# Patient Record
Sex: Female | Born: 1965 | Race: White | Hispanic: No | Marital: Married | Smoking: Former smoker
Health system: Southern US, Community
[De-identification: ages and names within clinical notes are randomized; demographics above are authoritative.]

## PROBLEM LIST (undated history)

## (undated) DIAGNOSIS — D225 Melanocytic nevi of trunk: Secondary | ICD-10-CM

## (undated) DIAGNOSIS — Z789 Other specified health status: Secondary | ICD-10-CM

## (undated) DIAGNOSIS — K602 Anal fissure, unspecified: Secondary | ICD-10-CM

## (undated) DIAGNOSIS — D229 Melanocytic nevi, unspecified: Secondary | ICD-10-CM

## (undated) DIAGNOSIS — K635 Polyp of colon: Secondary | ICD-10-CM

## (undated) HISTORY — PX: BREAST BIOPSY: SHX20

## (undated) HISTORY — DX: Polyp of colon: K63.5

## (undated) HISTORY — PX: ENDOMETRIAL ABLATION: SHX621

## (undated) HISTORY — PX: TUBAL LIGATION: SHX77

## (undated) HISTORY — DX: Anal fissure, unspecified: K60.2

---
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
## 2000-10-01 ENCOUNTER — Emergency Department (HOSPITAL_COMMUNITY): Admission: EM | Admit: 2000-10-01 | Discharge: 2000-10-01 | Payer: Self-pay | Admitting: *Deleted

## 2000-10-01 ENCOUNTER — Encounter: Payer: Self-pay | Admitting: *Deleted

## 2003-02-19 ENCOUNTER — Other Ambulatory Visit: Admission: RE | Admit: 2003-02-19 | Discharge: 2003-02-19 | Payer: Self-pay | Admitting: Obstetrics and Gynecology

## 2004-03-27 ENCOUNTER — Other Ambulatory Visit: Admission: RE | Admit: 2004-03-27 | Discharge: 2004-03-27 | Payer: Self-pay | Admitting: Obstetrics and Gynecology

## 2005-05-21 ENCOUNTER — Other Ambulatory Visit: Admission: RE | Admit: 2005-05-21 | Discharge: 2005-05-21 | Payer: Self-pay | Admitting: Obstetrics and Gynecology

## 2007-08-18 ENCOUNTER — Encounter: Admission: RE | Admit: 2007-08-18 | Discharge: 2007-08-18 | Payer: Self-pay | Admitting: Obstetrics and Gynecology

## 2008-08-03 ENCOUNTER — Encounter (INDEPENDENT_AMBULATORY_CARE_PROVIDER_SITE_OTHER): Payer: Self-pay | Admitting: Obstetrics and Gynecology

## 2008-08-03 ENCOUNTER — Ambulatory Visit (HOSPITAL_COMMUNITY): Admission: RE | Admit: 2008-08-03 | Discharge: 2008-08-03 | Payer: Self-pay | Admitting: Obstetrics and Gynecology

## 2010-02-19 ENCOUNTER — Encounter
Admission: RE | Admit: 2010-02-19 | Discharge: 2010-02-19 | Payer: Self-pay | Source: Home / Self Care | Attending: Obstetrics and Gynecology | Admitting: Obstetrics and Gynecology

## 2010-05-11 LAB — CBC
HCT: 33.8 % — ABNORMAL LOW (ref 36.0–46.0)
Hemoglobin: 11.2 g/dL — ABNORMAL LOW (ref 12.0–15.0)
MCHC: 33.3 g/dL (ref 30.0–36.0)
MCV: 78.7 fL (ref 78.0–100.0)
Platelets: 217 10*3/uL (ref 150–400)
RBC: 4.29 MIL/uL (ref 3.87–5.11)
RDW: 14.7 % (ref 11.5–15.5)
WBC: 5.3 10*3/uL (ref 4.0–10.5)

## 2010-05-11 LAB — HCG, SERUM, QUALITATIVE: Preg, Serum: NEGATIVE

## 2010-06-17 NOTE — Op Note (Signed)
Tonya Torres, GUNKEL               ACCOUNT NO.:  [id number]   MEDICAL RECORD NO.:  [id number]          PATIENT TYPE:  AMB   LOCATION:  SDC                           FACILITY:  [facility]   PHYSICIAN:  Juluis Mire, M.D.   DATE OF BIRTH:  02/02/1966   DATE OF PROCEDURE:  08/03/2008  DATE OF DISCHARGE:                               OPERATIVE REPORT   PREOPERATIVE DIAGNOSES:  Uterine fibroids with associated menorrhagia.  Desires sterility.   POSTOPERATIVE DIAGNOSES:  Uterine fibroids with associated menorrhagia.  Desires sterility.   PROCEDURES:  Attempted hydrothermal ablation.  Followed by hysteroscopy  with ThermaChoice ablation.  Laparoscopic bilateral tubal ligation.   SURGEON:  Juluis Mire, MD   ANESTHESIA:  General endotracheal.   ESTIMATED BLOOD LOSS:  Minimal.   PACKS AND DRAINS:  None.   INTRAOPERATIVE BLOOD PLACED:  None.   COMPLICATIONS:  None.   INDICATIONS:  Dictated in the history and physical.   PROCEDURE IN DETAILS:  The patient was taken to the OR, placed supine  position.  After satisfactory level of general endotracheal anesthesia  was obtained, the patient was placed in the dorsal lithotomy position  using the Allen stirrups.  The abdomen, perineum, and vagina were  prepped out of Betadine.  Speculum was placed in the vaginal vault.  The  cervix was grasped with single-tooth tenaculum.  The uterus sounded to  11 cm.  Cervix was dilated to 23 Pratt dilator.  Endometrial curettings  were obtained, sent for pathological review.  The hydrothermal ablation  was properly inserted.  Visualization of intrauterine cavity revealed a  large submucosal fibroid anteriorly.  The uterine cavity was otherwise  clear.  We had 3 attempts getting a seal with the hydrothermal ablation  instrument.  We placed Jacobs tenaculums on the anterior and posterior  lip of the cervix, despite that we continued to have a significant fluid  loss, so we could not see through the vaginal  area.  It was not enough  to be concerned about a perforation.  It was felt that it is possibly  the size of the cavity.  At this point in time, the fluid was cooled  down.  The hydrothermal ablation instrument was removed from the cervix.  We removed the posterior tenaculum, we left the anterior tenaculum, we  went ahead and hysteroscoped her at this point in time.  We distended  the uterine cavity using sorbitol.  Visualization actually revealed the  submucosal fibroid anteriorly, but there was no signs of any  perforations and with hysteroscopy, there was no significant fluid loss.  At this point in time, we brought in a ThermaChoice.  It was properly  primed and inserted into the intrauterine cavity.  We filled the balloon  until we got continued pressure in the 160s.  Ablation was then  undertaken for 8 minutes.  After the cool down period, the balloon was  decompressed and the ThermaChoice was removed.  Repeat hysteroscopy  revealed actually marked shrinking of the anterior wall fibroid.  Then,  the endometrial cavity was well blanched.  There was no  signs of  perforation.  At this point in time, a Hulka tenaculum was put in place,  single-tooth tenaculum was removed.  Bladder was drained without  catheterization and the speculum had been removed.   The patient's legs were repositioned.  A subumbilical incision was made  with a knife.  Veress needle was introduced into the abdominal cavity.  The abdomen was inflated with approximately 3-1/2 L of carbon dioxide.  Trocar was put in place followed by the laparoscope.  There was no  evidence of any injury to adjacent organs.  Uterus had a large posterior  wall fibroid.  Tubes and ovaries were unremarkable.  Cul-de-sac was  clear.  There was no signs of perforation or no signs of any bowel  injury.  I did not see the appendix may have been retrocecal, but only  have the operative scope in.  The upper abdomen including the liver tip,   the gallbladder and both lateral gutters were cleared.  Using the  bipolar, 3-cm segment of each tube was coagulated until resistance read  0.  Each tube was recoagulated until we completely desiccated the tube.  Coagulation did extend out to the mesosalpinx.  At this point in time,  both tubes were adequately coagulated.  There was no evidence of injury  to adjacent organs.  Laparoscope was removed.  The abdomen was deflated  using carbon dioxide.  Trocars were removed.  Subumbilical incision was  closed with interrupted subcuticulars of 4-0 Vicryl.  The Hulka  tenaculum was then taken out.  The patient was taken out of the dorsal  lithotomy position.  Once alert and extubated, transferred to recovery  room in good condition.  Sponge, instrument, and needle count report was  correct by circulating nurse x2.      Juluis Mire, M.D.  Electronically Signed     JSM/MEDQ  D:  08/03/2008  T:  08/04/2008  Job:  [id number]

## 2010-06-17 NOTE — H&P (Signed)
Tonya Torres, ANTONELLI NO.:  [id number]   MEDICAL RECORD NO.:  [id number]        PATIENT TYPE:  WAMB   LOCATION:                                FACILITY:  [facility]   PHYSICIAN:  Juluis Mire, M.D.   DATE OF BIRTH:  [birth year]   DATE OF ADMISSION:  08/03/2008  DATE OF DISCHARGE:                              HISTORY & PHYSICAL   This is a 45 year old gravida 1, para 1 female who presents for  laparoscopic bilateral tubal ligation and hydrothermal ablation.   In relation to present admission, the patient has been having extremely  heavy periods lasting 3 days.  She will change pads every hour with  clots.  She is having some pain and discomfort and pain during  intercourse.  She underwent a saline infusion ultrasound, revealed  several fibroids, mostly intramural; there was one that was submucosal,  but had minimal endometrial involvement.  In view of this, the patient  decided to proceed with ablation.  We will do laparoscopic tubal at the  same time and evaluate the pelvic status.   In terms of allergies, she is allergic to PENICILLIN.   MEDICATIONS:  Birth control pills.   PAST MEDICAL HISTORY:  Usual childhood disease without significant  sequelae.  No surgical history.   PREVIOUS OBSTETRICAL HISTORY:  She is at one vaginal delivery.   FAMILY HISTORY:  Noncontributory.   SOCIAL HISTORY:  No tobacco or alcohol use.   REVIEW OF SYSTEMS:  Noncontributory.   PHYSICAL EXAMINATION:  GENERAL:  The patient is afebrile.  VITAL SIGNS:  Stable vital signs.  HEENT: The patient is normocephalic.  Pupils are equal, round, and  reactive to light and accommodation.  Extraocular movements were intact.  Sclerae and conjunctivae are clear.  Oropharynx clear.  NECK:  Without thyromegaly.  BREASTS:  No discrete masses.  LUNGS:  Clear.  CARDIAC:  Regular rhythm and rate without murmurs or gallops.  ABDOMEN:  Benign.  No mass, organomegaly, or tenderness.  PELVIC:  Normal  external genitalia.  Vaginal mucosa is clear.  Cervix  unremarkable.  Uterus upper limits of normal size.  Adnexa unremarkable.  EXTREMITIES:  Trace edema.  NEUROLOGIC:  Grossly within normal limits.   IMPRESSION:  1. Menorrhagia.  2. Uterine fibroids.  3. Desires sterility.   PLAN:  The patient to undergo a laparoscopic bilateral tubal ligation.  Irreversibility of sterilization explained.  Potential failure rate of 1  in 300 described, this can be in the form of ectopic pregnancy requiring  further surgical management.  Risk of procedure explained including the  risk of infection.  The risk of vascular injury that could lead to  hemorrhage requiring transfusion with associated risk of AIDS or  hepatitis, risk of injury to adjacent organs including bladder, bowel,  ureters that could require further exploratory surgery, risk of deep  venous thrombosis and pulmonary embolus.  With hydrothermal ablation as  a risk of spilled fluid, it could lead to vaginal and vulvar burns.  The  risk of injury to adjacent organs such as bowel requiring further  exploratory surgery.  Success  rates of 80% are quoted.  The patient  expressed to understand risk, indications, and other alternatives.      Juluis Mire, M.D.  Electronically Signed     JSM/MEDQ  D:  08/03/2008  T:  08/03/2008  Job:  [id number]

## 2012-08-08 ENCOUNTER — Emergency Department (HOSPITAL_COMMUNITY)
Admission: EM | Admit: 2012-08-08 | Discharge: 2012-08-09 | Disposition: A | Discharge status: Home / Self Care | Payer: BC Managed Care – PPO | Attending: Emergency Medicine | Admitting: Emergency Medicine

## 2012-08-08 ENCOUNTER — Encounter (HOSPITAL_COMMUNITY): Payer: Self-pay | Admitting: Adult Health

## 2012-08-08 DIAGNOSIS — Z3202 Encounter for pregnancy test, result negative: Secondary | ICD-10-CM | POA: Insufficient documentation

## 2012-08-08 DIAGNOSIS — B9689 Other specified bacterial agents as the cause of diseases classified elsewhere: Secondary | ICD-10-CM | POA: Insufficient documentation

## 2012-08-08 DIAGNOSIS — R110 Nausea: Secondary | ICD-10-CM | POA: Insufficient documentation

## 2012-08-08 DIAGNOSIS — A499 Bacterial infection, unspecified: Secondary | ICD-10-CM | POA: Insufficient documentation

## 2012-08-08 DIAGNOSIS — N838 Other noninflammatory disorders of ovary, fallopian tube and broad ligament: Secondary | ICD-10-CM | POA: Insufficient documentation

## 2012-08-08 DIAGNOSIS — N83209 Unspecified ovarian cyst, unspecified side: Secondary | ICD-10-CM

## 2012-08-08 DIAGNOSIS — N898 Other specified noninflammatory disorders of vagina: Secondary | ICD-10-CM | POA: Insufficient documentation

## 2012-08-08 DIAGNOSIS — D369 Benign neoplasm, unspecified site: Secondary | ICD-10-CM | POA: Insufficient documentation

## 2012-08-08 DIAGNOSIS — R6883 Chills (without fever): Secondary | ICD-10-CM | POA: Insufficient documentation

## 2012-08-08 DIAGNOSIS — N760 Acute vaginitis: Secondary | ICD-10-CM | POA: Insufficient documentation

## 2012-08-08 DIAGNOSIS — D219 Benign neoplasm of connective and other soft tissue, unspecified: Secondary | ICD-10-CM

## 2012-08-08 LAB — URINALYSIS, ROUTINE W REFLEX MICROSCOPIC
Glucose, UA: NEGATIVE mg/dL
Ketones, ur: NEGATIVE mg/dL
Leukocytes, UA: NEGATIVE
Protein, ur: NEGATIVE mg/dL
Urobilinogen, UA: 0.2 mg/dL (ref 0.0–1.0)

## 2012-08-08 LAB — WET PREP, GENITAL: Yeast Wet Prep HPF POC: NONE SEEN

## 2012-08-08 LAB — CBC WITH DIFFERENTIAL/PLATELET
Basophils Absolute: 0 10*3/uL (ref 0.0–0.1)
Basophils Relative: 0 % (ref 0–1)
Eosinophils Absolute: 0.1 10*3/uL (ref 0.0–0.7)
HCT: 34.2 % — ABNORMAL LOW (ref 36.0–46.0)
Hemoglobin: 11.6 g/dL — ABNORMAL LOW (ref 12.0–15.0)
MCH: 27.8 pg (ref 26.0–34.0)
MCHC: 33.9 g/dL (ref 30.0–36.0)
Monocytes Absolute: 0.8 10*3/uL (ref 0.1–1.0)
Monocytes Relative: 7 % (ref 3–12)
Neutro Abs: 10.7 10*3/uL — ABNORMAL HIGH (ref 1.7–7.7)
RDW: 13.5 % (ref 11.5–15.5)

## 2012-08-08 LAB — COMPREHENSIVE METABOLIC PANEL
Albumin: 3.6 g/dL (ref 3.5–5.2)
BUN: 11 mg/dL (ref 6–23)
Calcium: 8.5 mg/dL (ref 8.4–10.5)
Creatinine, Ser: 0.74 mg/dL (ref 0.50–1.10)
GFR calc Af Amer: >90 mL/min (ref >90)
Glucose, Bld: 106 mg/dL — ABNORMAL HIGH (ref 70–99)
Total Protein: 6.6 g/dL (ref 6.0–8.3)

## 2012-08-08 LAB — URINE MICROSCOPIC-ADD ON

## 2012-08-08 LAB — LIPASE, BLOOD: Lipase: 33 U/L (ref 11–59)

## 2012-08-08 LAB — POCT I-STAT TROPONIN I: Troponin i, poc: 0.01 ng/mL (ref 0.00–0.08)

## 2012-08-08 MED ORDER — IOHEXOL 300 MG/ML  SOLN
50.0000 mL | Freq: Once | INTRAMUSCULAR | Status: AC | PRN
  Administered 2012-08-08: 50 mL via ORAL

## 2012-08-08 MED ORDER — SODIUM CHLORIDE 0.9 % IV BOLUS (SEPSIS)
1000.0000 mL | Freq: Once | INTRAVENOUS | Status: AC
  Administered 2012-08-08: 1000 mL via INTRAVENOUS

## 2012-08-08 NOTE — ED Provider Notes (Signed)
History    CSN: [id number] Arrival date & time 08/08/12  2225  First MD Initiated Contact with Patient 08/08/12 2311     Chief Complaint  Patient presents with  . Abdominal Pain   (Consider location/radiation/quality/duration/timing/severity/associated sxs/prior Treatment) The history is provided by the patient.  Tonya Torres is a 47 y.o. female hx of fibroids here with abdominal pain and vaginal bleeding. Vaginal bleeding the last few days but tonight had more cramps. She was watching a game around 10 PM and worse cramps and felt nauseous and had some subjective chills. He states that this is different her usual pain with menses or fibroids. Took motrin without relief.   History reviewed. No pertinent past medical history. History reviewed. No pertinent past surgical history. History reviewed. No pertinent family history. History  Substance Use Topics  . Smoking status: Never Smoker   . Smokeless tobacco: Not on file  . Alcohol Use: No   OB History   Grav Para Term Preterm Abortions TAB SAB Ect Mult Living                 Review of Systems  Gastrointestinal: Positive for nausea and abdominal pain.  All other systems reviewed and are negative.    Allergies  Penicillins  Home Medications   Current Outpatient Rx  Name  Route  Sig  Dispense  Refill  . ibuprofen (ADVIL,MOTRIN) 200 MG tablet   Oral   Take 600 mg by mouth 2 (two) times daily as needed (menstrual cramps).          BP 130/64  Pulse 85  Temp(Src) 99.6 F (37.6 C) (Oral)  Resp 20  SpO2 96% Physical Exam  Nursing note and vitals reviewed. Constitutional: She is oriented to person, place, and time. She appears well-developed and well-nourished.  HENT:  Head: Normocephalic.  Mouth/Throat: Oropharynx is clear and moist.  Eyes: Conjunctivae are normal. Pupils are equal, round, and reactive to light.  Neck: Normal range of motion. Neck supple.  Cardiovascular: Normal rate, regular rhythm and normal  heart sounds.   Pulmonary/Chest: Effort normal and breath sounds normal. No respiratory distress. She has no wheezes. She has no rales.  Abdominal: Soft.  Diffuse lower abdominal tenderness, no rebound.   Genitourinary:  Dried blood at os, no CMT. Enlarged uterus, mild uterine tenderness. No adnexal tenderness but when palpate L side abdomen, there is pain across lower abdomen.   Musculoskeletal: Normal range of motion.  Neurological: She is alert and oriented to person, place, and time.  Skin: Skin is warm and dry.  Psychiatric: She has a normal mood and affect. Her behavior is normal. Judgment and thought content normal.    ED Course  Procedures (including critical care time) Labs Reviewed  WET PREP, GENITAL - Abnormal; Notable for the following:    Clue Cells Wet Prep HPF POC MODERATE (*)    WBC, Wet Prep HPF POC FEW (*)    All other components within normal limits  CBC WITH DIFFERENTIAL - Abnormal; Notable for the following:    WBC 12.6 (*)    Hemoglobin 11.6 (*)    HCT 34.2 (*)    Neutrophils Relative % 85 (*)    Neutro Abs 10.7 (*)    Lymphocytes Relative 8 (*)    All other components within normal limits  COMPREHENSIVE METABOLIC PANEL - Abnormal; Notable for the following:    Glucose, Bld 106 (*)    All other components within normal limits  URINALYSIS, ROUTINE  W REFLEX MICROSCOPIC - Abnormal; Notable for the following:    Hgb urine dipstick SMALL (*)    All other components within normal limits  URINE MICROSCOPIC-ADD ON - Abnormal; Notable for the following:    Bacteria, UA FEW (*)    All other components within normal limits  GC/CHLAMYDIA PROBE AMP  LIPASE, BLOOD  PREGNANCY, URINE  POCT I-STAT TROPONIN I  POCT PREGNANCY, URINE   Ct Abdomen Pelvis W Contrast  08/09/2012   *RADIOLOGY REPORT*  Clinical Data: Right lower quadrant pain.  Nausea, dizziness, and chills.  CT ABDOMEN AND PELVIS WITH CONTRAST  Technique:  Multidetector CT imaging of the abdomen and pelvis was  performed following the standard protocol during bolus administration of intravenous contrast.  Contrast: OMNIPAQUE IOHEXOL 300 MG/ML  SOLN  Comparison: None.  Findings: Dependent atelectasis in the lung bases.  The liver, spleen, gallbladder, pancreas, adrenal glands, kidneys, abdominal aorta, inferior vena cava, and retroperitoneal lymph nodes are unremarkable. Portal and mesenteric vessels are patent. Stomach and small bowel are not abnormally distended.  Stool filled colon without distension.  No free air or free fluid in the abdomen.  Abdominal wall musculature appears intact.  Pelvis:  The appendix is normal.  The uterus appears retroflexed and is heterogeneous suggesting leiomyomas.  Bilateral ovarian cysts without ovarian enlargement.  Small amount of free fluid in the pelvis is likely to be physiologic.  No evidence of diverticulitis.  No significant pelvic lymphadenopathy.  Normal alignment of the lumbar vertebrae.  No destructive bone lesions.  IMPRESSION: The appendix is normal.  Small amount of free fluid in the pelvis likely to be physiologic.  Fibroid uterus.   Original Report Authenticated By: Burman Nieves, M.D.   No diagnosis found.  MDM  Tonya Torres is a 47 y.o. female here with lower abdominal pain, vaginal bleeding. Vaginal exam unremarkable. Given elevated WBC count, will do CT ab/pel to r/o appy. Otherwise, i think she likely has cramps from menses vs fibroids.   1:43 AM Nl Appendix. Small free fluid in pelvis. I think she likely had a ruptured ovarian cyst. Wet prep + clue cells, will give flagyl. Will have her f/u with GYN.   Richardean Canal, MD 08/09/12 [phone]

## 2012-08-08 NOTE — ED Notes (Signed)
Presents with sudden onset of generalized abdominal pain, nausea, dizziness and chills that began at 10 pm. Pain is described as "soreness and cramping" associated with nausea. Movement makes pain worse, deep inspiration makes pain worse.

## 2012-08-09 ENCOUNTER — Emergency Department (HOSPITAL_COMMUNITY): Payer: BC Managed Care – PPO

## 2012-08-09 LAB — GC/CHLAMYDIA PROBE AMP
CT Probe RNA: NEGATIVE
GC Probe RNA: NEGATIVE

## 2012-08-09 MED ORDER — IOHEXOL 300 MG/ML  SOLN
100.0000 mL | Freq: Once | INTRAMUSCULAR | Status: AC | PRN
  Administered 2012-08-09: 100 mL via INTRAVENOUS

## 2012-08-09 MED ORDER — METRONIDAZOLE 500 MG PO TABS: 500.0000 mg | ORAL_TABLET | Freq: Two times a day (BID) | ORAL | Status: DC

## 2012-08-09 MED ORDER — METRONIDAZOLE 500 MG PO TABS
500.0000 mg | ORAL_TABLET | Freq: Once | ORAL | Status: AC
  Administered 2012-08-09: 500 mg via ORAL
  Filled 2012-08-09: qty 1

## 2012-08-09 NOTE — ED Notes (Signed)
Notified CT patient is ready.

## 2012-08-09 NOTE — ED Notes (Signed)
Patient transported to CT 

## 2012-12-09 ENCOUNTER — Encounter (HOSPITAL_COMMUNITY): Payer: Self-pay

## 2012-12-19 ENCOUNTER — Inpatient Hospital Stay (HOSPITAL_COMMUNITY): Admission: RE | Admit: 2012-12-19 | Payer: BC Managed Care – PPO | Source: Ambulatory Visit

## 2012-12-20 ENCOUNTER — Encounter (HOSPITAL_COMMUNITY): Payer: Self-pay

## 2012-12-20 ENCOUNTER — Encounter (HOSPITAL_COMMUNITY)
Admission: RE | Admit: 2012-12-20 | Discharge: 2012-12-20 | Disposition: A | Discharge status: Home / Self Care | Payer: BC Managed Care – PPO | Source: Ambulatory Visit | Attending: Obstetrics and Gynecology | Admitting: Obstetrics and Gynecology

## 2012-12-20 DIAGNOSIS — Z01818 Encounter for other preprocedural examination: Secondary | ICD-10-CM | POA: Insufficient documentation

## 2012-12-20 DIAGNOSIS — Z01812 Encounter for preprocedural laboratory examination: Secondary | ICD-10-CM | POA: Insufficient documentation

## 2012-12-20 HISTORY — DX: Other specified health status: Z78.9

## 2012-12-20 LAB — CBC
Hemoglobin: 12.7 g/dL (ref 12.0–15.0)
MCH: 27.5 pg (ref 26.0–34.0)
MCHC: 33.2 g/dL (ref 30.0–36.0)
MCV: 82.7 fL (ref 78.0–100.0)
RBC: 4.62 MIL/uL (ref 3.87–5.11)

## 2012-12-20 NOTE — Patient Instructions (Signed)
Your procedure is scheduled on:12/26/12  Enter through the Main Entrance at :6am Pick up desk phone and dial [phone] and inform us of your arrival.  Please call [phone] if you have any problems the morning of surgery.  Remember: Do not eat food or drink liquids, including water, after midnight:Sunday   You may brush your teeth the morning of surgery.   DO NOT wear jewelry, eye make-up, lipstick,body lotion, or dark fingernail polish.  (Polished toes are ok) You may wear deodorant.  If you are to be admitted after surgery, leave suitcase in car until your room has been assigned. Patients discharged on the day of surgery will not be allowed to drive home. Wear loose fitting, comfortable clothes for your ride home.

## 2012-12-21 NOTE — H&P (Signed)
Tonya Torres, Tonya Torres NO.:  [id number]  MEDICAL RECORD NO.:  [id number]  LOCATION:  SDC                           FACILITY:  [facility]  PHYSICIAN:  Juluis Mire, M.D.   DATE OF BIRTH:  November 20, 1965  DATE OF ADMISSION:  12/20/2012 DATE OF DISCHARGE:  12/20/2012                             HISTORY & PHYSICAL   HISTORY OF PRESENT ILLNESS:  The patient is a 47 year old gravida 1, para 1, female presents for laparoscopic-assisted vaginal hysterectomy and removal of fallopian tubes.  The patient has a history of uterine fibroids.  In 2010, she underwent a bilateral tubal ligation and Thermachoice ablation.  At the present time, she is having minimal bleeding, but has significant cyclic pain and discomfort with her cycles.  She had a CT scan because of an emergency room visit, which was unremarkable.  Subsequent ultrasound here was highly suggestive of adenomyosis.  She did have multiple small fibroids.  She also complains of pain with intercourse during deep penetration consistent with the diagnosis of adenomyosis.  We had discussed options including Cyclen birth control pills versus Depo-Provera versus laparoscopic evaluation versus laparoscopically-assisted vaginal hysterectomy, which she is in favor at the present time.  Ovaries will be left in place per her request.  Potential risk of malignant transformation has been discussed. We will discuss removing the fallopian tubes to hopefully reduce the chance of ovarian cancer.  ALLERGIES:  The patient is allergic to PENICILLIN.  She is on birth control pills at the present time trying to help the pain and discomfort.  PAST MEDICAL HISTORY:  Usual childhood diseases.  No significant sequelae.  She had a previous laparoscopic evaluation, Thermachoice ablation, and she has had 1 vaginal delivery.  FAMILY HISTORY:  Noncontributory.  SOCIAL HISTORY:  No tobacco or alcohol use.  REVIEW OF SYSTEMS:   Noncontributory.  PHYSICAL EXAMINATION:  VITAL SIGNS:  The patient is afebrile, stable vital signs. HEENT:  The patient is normocephalic.  Pupils equal, round, and reactive to light and accommodation.  Extraocular movements were intact.  Sclerae and conjunctivae are clear.  Oropharynx clear. NECK:  Without thyromegaly. BREASTS:  No discrete masses. LUNGS:  Clear. CARDIOVASCULAR:  Regular rate.  No murmurs or gallops. ABDOMEN:  No carotid or abdominal bruits.  Abdominal exam is benign.  No mass, organomegaly, or tenderness. PELVIC:  Normal external genitalia.  Vaginal mucosa is clear.  Cervix unremarkable.  Uterus upper limits of normal size, slightly irregular. Adnexa unremarkable. EXTREMITIES:  Trace edema. NEUROLOGIC:  Grossly within normal limits.  IMPRESSION: 1. 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 2. Previous Thermachoice ablation and tubal ligation.  PLAN OF MANAGEMENT:  The patient will have laparoscopically-assisted vaginal hysterectomy in an attempt to remove both fallopian tubes. Risks of surgery have been discussed including the risk of infection. The risk of hemorrhage that could require transfusion with the risk of AIDS or hepatitis.  The risk of injury to adjacent organs including bladder, bowel, ureters that could require further exploratory surgery. Risk of deep venous thrombosis and pulmonary embolus.  The patient does understand potential risks and complications, and alternatives.     Raphael Gibney.  Arelia Sneddon, M.D.     JSM/MEDQ  D:  12/21/2012  T:  12/21/2012  Job:  [id number]

## 2012-12-21 NOTE — H&P (Signed)
  Patient name  Tonya Torres, Tonya Torres ZOXWRUEAV#[id number] CSN# [id number]  [facility], MD 12/21/2012 10:01 AM

## 2012-12-23 ENCOUNTER — Encounter (HOSPITAL_COMMUNITY): Payer: Self-pay | Admitting: Anesthesiology

## 2012-12-23 NOTE — Anesthesia Preprocedure Evaluation (Addendum)
Anesthesia Evaluation  Patient identified by MRN, date of birth, ID band Patient awake    Reviewed: Allergy & Precautions, H&P , NPO status , Patient's Chart, lab work & pertinent test results  Airway Mallampati: II TM Distance: >3 FB Neck ROM: Full    Dental no notable dental hx. (+) Teeth Intact   Pulmonary neg pulmonary ROS,  breath sounds clear to auscultation  Pulmonary exam normal       Cardiovascular negative cardio ROS  Rhythm:Regular Rate:Normal     Neuro/Psych negative neurological ROS  negative psych ROS   GI/Hepatic negative GI ROS, Neg liver ROS,   Endo/Other  Obesity  Renal/GU negative Renal ROS  negative genitourinary   Musculoskeletal   Abdominal   Peds  Hematology   Anesthesia Other Findings   Reproductive/Obstetrics Pelvic Pain Fibroid uterus                         Anesthesia Physical Anesthesia Plan  ASA: II  Anesthesia Plan: General   Post-op Pain Management:    Induction: Intravenous  Airway Management Planned: Oral ETT  Additional Equipment:   Intra-op Plan:   Post-operative Plan: Extubation in OR  Informed Consent: I have reviewed the patients History and Physical, chart, labs and discussed the procedure including the risks, benefits and alternatives for the proposed anesthesia with the patient or authorized representative who has indicated his/her understanding and acceptance.   Dental advisory given  Plan Discussed with: Anesthesiologist, CRNA and Surgeon  Anesthesia Plan Comments:        Anesthesia Quick Evaluation

## 2012-12-25 MED ORDER — CIPROFLOXACIN IN D5W 400 MG/200ML IV SOLN
400.0000 mg | INTRAVENOUS | Status: AC
  Administered 2012-12-26: 400 mg via INTRAVENOUS
  Filled 2012-12-25: qty 200

## 2012-12-25 MED ORDER — CLINDAMYCIN PHOSPHATE 900 MG/50ML IV SOLN
900.0000 mg | INTRAVENOUS | Status: AC
  Administered 2012-12-26: 900 mg via INTRAVENOUS
  Filled 2012-12-25: qty 50

## 2012-12-26 ENCOUNTER — Ambulatory Visit (HOSPITAL_COMMUNITY): Payer: BC Managed Care – PPO | Admitting: Anesthesiology

## 2012-12-26 ENCOUNTER — Encounter (HOSPITAL_COMMUNITY): Payer: Self-pay | Admitting: Anesthesiology

## 2012-12-26 ENCOUNTER — Encounter (HOSPITAL_COMMUNITY): Payer: BC Managed Care – PPO | Admitting: Anesthesiology

## 2012-12-26 ENCOUNTER — Observation Stay (HOSPITAL_COMMUNITY)
Admission: RE | Admit: 2012-12-26 | Discharge: 2012-12-27 | Disposition: A | Discharge status: Home / Self Care | Payer: BC Managed Care – PPO | Source: Ambulatory Visit | Attending: Obstetrics and Gynecology | Admitting: Obstetrics and Gynecology

## 2012-12-26 ENCOUNTER — Encounter (HOSPITAL_COMMUNITY)
Admission: RE | Disposition: A | Discharge status: Home / Self Care | Payer: Self-pay | Source: Ambulatory Visit | Attending: Obstetrics and Gynecology

## 2012-12-26 DIAGNOSIS — N949 Unspecified condition associated with female genital organs and menstrual cycle: Principal | ICD-10-CM | POA: Insufficient documentation

## 2012-12-26 DIAGNOSIS — N809 Endometriosis, unspecified: Secondary | ICD-10-CM | POA: Diagnosis present

## 2012-12-26 DIAGNOSIS — N838 Other noninflammatory disorders of ovary, fallopian tube and broad ligament: Secondary | ICD-10-CM | POA: Insufficient documentation

## 2012-12-26 DIAGNOSIS — D251 Intramural leiomyoma of uterus: Secondary | ICD-10-CM | POA: Insufficient documentation

## 2012-12-26 DIAGNOSIS — N803 Endometriosis of pelvic peritoneum, unspecified: Secondary | ICD-10-CM | POA: Insufficient documentation

## 2012-12-26 DIAGNOSIS — D259 Leiomyoma of uterus, unspecified: Secondary | ICD-10-CM | POA: Diagnosis present

## 2012-12-26 DIAGNOSIS — N801 Endometriosis of ovary: Secondary | ICD-10-CM | POA: Insufficient documentation

## 2012-12-26 DIAGNOSIS — N736 Female pelvic peritoneal adhesions (postinfective): Secondary | ICD-10-CM

## 2012-12-26 DIAGNOSIS — N8000 Endometriosis of the uterus, unspecified: Secondary | ICD-10-CM | POA: Insufficient documentation

## 2012-12-26 DIAGNOSIS — N80109 Endometriosis of ovary, unspecified side, unspecified depth: Secondary | ICD-10-CM | POA: Insufficient documentation

## 2012-12-26 DIAGNOSIS — Z90710 Acquired absence of both cervix and uterus: Secondary | ICD-10-CM | POA: Clinically undetermined

## 2012-12-26 HISTORY — PX: LAPAROSCOPIC ASSISTED VAGINAL HYSTERECTOMY: SHX5398

## 2012-12-26 LAB — HCG, SERUM, QUALITATIVE: Preg, Serum: NEGATIVE

## 2012-12-26 SURGERY — HYSTERECTOMY, VAGINAL, LAPAROSCOPY-ASSISTED
Anesthesia: General

## 2012-12-26 MED ORDER — METOCLOPRAMIDE HCL 5 MG/ML IJ SOLN: 10.0000 mg | Freq: Once | INTRAMUSCULAR | Status: DC | PRN

## 2012-12-26 MED ORDER — MIDAZOLAM HCL 2 MG/2ML IJ SOLN
INTRAMUSCULAR | Status: AC
  Filled 2012-12-26: qty 2

## 2012-12-26 MED ORDER — LACTATED RINGERS IV SOLN
INTRAVENOUS | Status: DC
  Administered 2012-12-26 (×2): via INTRAVENOUS

## 2012-12-26 MED ORDER — NEOSTIGMINE METHYLSULFATE 1 MG/ML IJ SOLN
INTRAMUSCULAR | Status: DC | PRN
  Administered 2012-12-26: 3 mg via INTRAVENOUS

## 2012-12-26 MED ORDER — NALOXONE HCL 0.4 MG/ML IJ SOLN: 0.4000 mg | INTRAMUSCULAR | Status: DC | PRN

## 2012-12-26 MED ORDER — METHYLENE BLUE 1 % INJ SOLN
INTRAMUSCULAR | Status: AC
  Filled 2012-12-26: qty 10

## 2012-12-26 MED ORDER — ROCURONIUM BROMIDE 100 MG/10ML IV SOLN
INTRAVENOUS | Status: DC | PRN
  Administered 2012-12-26: 40 mg via INTRAVENOUS

## 2012-12-26 MED ORDER — ONDANSETRON HCL 4 MG PO TABS: 4.0000 mg | ORAL_TABLET | Freq: Four times a day (QID) | ORAL | Status: DC | PRN

## 2012-12-26 MED ORDER — LIDOCAINE HCL (CARDIAC) 20 MG/ML IV SOLN
INTRAVENOUS | Status: AC
  Filled 2012-12-26: qty 5

## 2012-12-26 MED ORDER — ZOLPIDEM TARTRATE 5 MG PO TABS: 5.0000 mg | ORAL_TABLET | Freq: Every evening | ORAL | Status: DC | PRN

## 2012-12-26 MED ORDER — OXYCODONE-ACETAMINOPHEN 5-325 MG PO TABS: 1.0000 | ORAL_TABLET | ORAL | Status: DC | PRN

## 2012-12-26 MED ORDER — BUPIVACAINE HCL (PF) 0.25 % IJ SOLN
INTRAMUSCULAR | Status: AC
  Filled 2012-12-26: qty 30

## 2012-12-26 MED ORDER — FENTANYL CITRATE 0.05 MG/ML IJ SOLN: 25.0000 ug | INTRAMUSCULAR | Status: DC | PRN

## 2012-12-26 MED ORDER — SCOPOLAMINE 1 MG/3DAYS TD PT72
1.0000 | MEDICATED_PATCH | Freq: Once | TRANSDERMAL | Status: DC
  Administered 2012-12-26: 1.5 mg via TRANSDERMAL

## 2012-12-26 MED ORDER — GLYCOPYRROLATE 0.2 MG/ML IJ SOLN
INTRAMUSCULAR | Status: DC | PRN
  Administered 2012-12-26: .5 mg via INTRAVENOUS

## 2012-12-26 MED ORDER — LIDOCAINE HCL (CARDIAC) 20 MG/ML IV SOLN
INTRAVENOUS | Status: DC | PRN
  Administered 2012-12-26: 80 mg via INTRAVENOUS

## 2012-12-26 MED ORDER — METHYLENE BLUE 1 % INJ SOLN
INTRAMUSCULAR | Status: DC | PRN
  Administered 2012-12-26: 10 mL via INTRAVENOUS

## 2012-12-26 MED ORDER — SODIUM CHLORIDE 0.9 % IJ SOLN: 9.0000 mL | INTRAMUSCULAR | Status: DC | PRN

## 2012-12-26 MED ORDER — PROPOFOL 10 MG/ML IV BOLUS
INTRAVENOUS | Status: DC | PRN
  Administered 2012-12-26: 180 mg via INTRAVENOUS

## 2012-12-26 MED ORDER — DIPHENHYDRAMINE HCL 12.5 MG/5ML PO ELIX: 12.5000 mg | ORAL_SOLUTION | Freq: Four times a day (QID) | ORAL | Status: DC | PRN

## 2012-12-26 MED ORDER — ONDANSETRON HCL 4 MG/2ML IJ SOLN
INTRAMUSCULAR | Status: AC
  Filled 2012-12-26: qty 2

## 2012-12-26 MED ORDER — DIPHENHYDRAMINE HCL 50 MG/ML IJ SOLN: 12.5000 mg | Freq: Four times a day (QID) | INTRAMUSCULAR | Status: DC | PRN

## 2012-12-26 MED ORDER — ROCURONIUM BROMIDE 100 MG/10ML IV SOLN
INTRAVENOUS | Status: AC
  Filled 2012-12-26: qty 1

## 2012-12-26 MED ORDER — HYDROMORPHONE HCL PF 1 MG/ML IJ SOLN
INTRAMUSCULAR | Status: AC
  Filled 2012-12-26: qty 1

## 2012-12-26 MED ORDER — HYDROMORPHONE HCL PF 1 MG/ML IJ SOLN
0.2500 mg | INTRAMUSCULAR | Status: DC | PRN
  Administered 2012-12-26 (×4): 0.5 mg via INTRAVENOUS

## 2012-12-26 MED ORDER — BUPIVACAINE HCL (PF) 0.25 % IJ SOLN
INTRAMUSCULAR | Status: DC | PRN
  Administered 2012-12-26: 7 mL

## 2012-12-26 MED ORDER — MIDAZOLAM HCL 2 MG/2ML IJ SOLN: 0.5000 mg | Freq: Once | INTRAMUSCULAR | Status: DC | PRN

## 2012-12-26 MED ORDER — SCOPOLAMINE 1 MG/3DAYS TD PT72
MEDICATED_PATCH | TRANSDERMAL | Status: AC
  Administered 2012-12-26: 1.5 mg via TRANSDERMAL
  Filled 2012-12-26: qty 1

## 2012-12-26 MED ORDER — LACTATED RINGERS IV SOLN
INTRAVENOUS | Status: DC
  Administered 2012-12-26 (×2): via INTRAVENOUS

## 2012-12-26 MED ORDER — DEXAMETHASONE SODIUM PHOSPHATE 10 MG/ML IJ SOLN
INTRAMUSCULAR | Status: DC | PRN
  Administered 2012-12-26: 10 mg via INTRAVENOUS

## 2012-12-26 MED ORDER — ONDANSETRON HCL 4 MG/2ML IJ SOLN
INTRAMUSCULAR | Status: DC | PRN
  Administered 2012-12-26: 4 mg via INTRAVENOUS

## 2012-12-26 MED ORDER — NEOSTIGMINE METHYLSULFATE 1 MG/ML IJ SOLN
INTRAMUSCULAR | Status: AC
  Filled 2012-12-26: qty 1

## 2012-12-26 MED ORDER — KETOROLAC TROMETHAMINE 30 MG/ML IJ SOLN
INTRAMUSCULAR | Status: DC | PRN
  Administered 2012-12-26: 30 mg via INTRAVENOUS

## 2012-12-26 MED ORDER — ACETAMINOPHEN 325 MG PO TABS
650.0000 mg | ORAL_TABLET | ORAL | Status: DC | PRN
  Administered 2012-12-27: 650 mg via ORAL
  Filled 2012-12-26: qty 2

## 2012-12-26 MED ORDER — FENTANYL CITRATE 0.05 MG/ML IJ SOLN
INTRAMUSCULAR | Status: DC | PRN
  Administered 2012-12-26 (×3): 50 ug via INTRAVENOUS

## 2012-12-26 MED ORDER — KETOROLAC TROMETHAMINE 30 MG/ML IJ SOLN
INTRAMUSCULAR | Status: AC
  Filled 2012-12-26: qty 1

## 2012-12-26 MED ORDER — MEPERIDINE HCL 25 MG/ML IJ SOLN: 6.2500 mg | INTRAMUSCULAR | Status: DC | PRN

## 2012-12-26 MED ORDER — PROMETHAZINE HCL 25 MG/ML IJ SOLN: 6.2500 mg | INTRAMUSCULAR | Status: DC | PRN

## 2012-12-26 MED ORDER — LACTATED RINGERS IR SOLN
Status: DC | PRN
  Administered 2012-12-26: 3000 mL

## 2012-12-26 MED ORDER — ONDANSETRON HCL 4 MG/2ML IJ SOLN: 4.0000 mg | Freq: Four times a day (QID) | INTRAMUSCULAR | Status: DC | PRN

## 2012-12-26 MED ORDER — PROPOFOL 10 MG/ML IV EMUL
INTRAVENOUS | Status: AC
  Filled 2012-12-26: qty 20

## 2012-12-26 MED ORDER — MENTHOL 3 MG MT LOZG: 1.0000 | LOZENGE | OROMUCOSAL | Status: DC | PRN

## 2012-12-26 MED ORDER — HYDROMORPHONE 0.3 MG/ML IV SOLN
INTRAVENOUS | Status: DC
  Administered 2012-12-26: 0.199 mg via INTRAVENOUS
  Administered 2012-12-26: 11:00:00 via INTRAVENOUS
  Filled 2012-12-26: qty 25

## 2012-12-26 MED ORDER — MIDAZOLAM HCL 2 MG/2ML IJ SOLN
INTRAMUSCULAR | Status: DC | PRN
  Administered 2012-12-26: 2 mg via INTRAVENOUS

## 2012-12-26 MED ORDER — GLYCOPYRROLATE 0.2 MG/ML IJ SOLN
INTRAMUSCULAR | Status: AC
  Filled 2012-12-26: qty 3

## 2012-12-26 MED ORDER — KETOROLAC TROMETHAMINE 30 MG/ML IJ SOLN: 15.0000 mg | Freq: Once | INTRAMUSCULAR | Status: DC | PRN

## 2012-12-26 MED ORDER — FENTANYL CITRATE 0.05 MG/ML IJ SOLN
INTRAMUSCULAR | Status: AC
  Filled 2012-12-26: qty 5

## 2012-12-26 MED ORDER — DEXAMETHASONE SODIUM PHOSPHATE 10 MG/ML IJ SOLN
INTRAMUSCULAR | Status: AC
  Filled 2012-12-26: qty 1

## 2012-12-26 SURGICAL SUPPLY — 40 items
CABLE HIGH FREQUENCY MONO STRZ (ELECTRODE) IMPLANT
CATH ROBINSON RED A/P 16FR (CATHETERS) ×2 IMPLANT
CLOTH BEACON ORANGE TIMEOUT ST (SAFETY) ×2 IMPLANT
CONT PATH 16OZ SNAP LID 3702 (MISCELLANEOUS) ×2 IMPLANT
COVER TABLE BACK 60X90 (DRAPES) ×2 IMPLANT
DECANTER SPIKE VIAL GLASS SM (MISCELLANEOUS) IMPLANT
DERMABOND ADVANCED (GAUZE/BANDAGES/DRESSINGS) ×1
DERMABOND ADVANCED .7 DNX12 (GAUZE/BANDAGES/DRESSINGS) ×1 IMPLANT
ELECT REM PT RETURN 9FT ADLT (ELECTROSURGICAL) ×2
ELECTRODE REM PT RTRN 9FT ADLT (ELECTROSURGICAL) ×1 IMPLANT
EVACUATOR SMOKE 8.L (FILTER) IMPLANT
GLOVE BIO SURGEON STRL SZ7 (GLOVE) ×4 IMPLANT
GLOVE BIOGEL PI IND STRL 6.5 (GLOVE) ×1 IMPLANT
GLOVE BIOGEL PI INDICATOR 6.5 (GLOVE) ×1
GOWN STRL REIN XL XLG (GOWN DISPOSABLE) ×8 IMPLANT
NEEDLE INSUFFLATION 120MM (ENDOMECHANICALS) IMPLANT
NS IRRIG 1000ML POUR BTL (IV SOLUTION) ×2 IMPLANT
PACK LAVH (CUSTOM PROCEDURE TRAY) ×2 IMPLANT
PROTECTOR NERVE ULNAR (MISCELLANEOUS) ×2 IMPLANT
SCISSORS LAP 5X35 DISP (ENDOMECHANICALS) ×2 IMPLANT
SEALER TISSUE G2 CVD JAW 45CM (ENDOMECHANICALS) ×2 IMPLANT
SET CYSTO W/LG BORE CLAMP LF (SET/KITS/TRAYS/PACK) ×2 IMPLANT
SET IRRIG TUBING LAPAROSCOPIC (IRRIGATION / IRRIGATOR) ×2 IMPLANT
STRIP CLOSURE SKIN 1/4X3 (GAUZE/BANDAGES/DRESSINGS) IMPLANT
SUT MON AB 2-0 CT1 27 (SUTURE) ×4 IMPLANT
SUT VIC AB 0 CT1 18XCR BRD8 (SUTURE) ×2 IMPLANT
SUT VIC AB 0 CT1 27 (SUTURE) ×1
SUT VIC AB 0 CT1 27XBRD ANBCTR (SUTURE) ×1 IMPLANT
SUT VIC AB 0 CT1 36 (SUTURE) ×2 IMPLANT
SUT VIC AB 0 CT1 8-18 (SUTURE) ×2
SUT VICRYL 0 UR6 27IN ABS (SUTURE) IMPLANT
SUT VICRYL 1 TIES 12X18 (SUTURE) ×2 IMPLANT
SUT VICRYL 4-0 PS2 18IN ABS (SUTURE) ×2 IMPLANT
TOWEL OR 17X24 6PK STRL BLUE (TOWEL DISPOSABLE) ×4 IMPLANT
TRAY FOLEY CATH 14FR (SET/KITS/TRAYS/PACK) ×2 IMPLANT
TROCAR BALLN 12MMX100 BLUNT (TROCAR) IMPLANT
TROCAR OPTI TIP 5M 100M (ENDOMECHANICALS) ×2 IMPLANT
TROCAR XCEL DIL TIP R 11M (ENDOMECHANICALS) ×2 IMPLANT
WARMER LAPAROSCOPE (MISCELLANEOUS) ×2 IMPLANT
WATER STERILE IRR 1000ML POUR (IV SOLUTION) ×2 IMPLANT

## 2012-12-26 NOTE — Transfer of Care (Signed)
Immediate Anesthesia Transfer of Care Note  Patient: Tonya Torres  Procedure(s) Performed: Procedure(s): LAPAROSCOPIC ASSISTED VAGINAL HYSTERECTOMY (N/A)  Patient Location: PACU  Anesthesia Type:General  Level of Consciousness: awake and alert   Airway & Oxygen Therapy: Patient Spontanous Breathing and Patient connected to nasal cannula oxygen  Post-op Assessment: Report given to PACU RN, Post -op Vital signs reviewed and stable and Patient moving all extremities  Post vital signs: Reviewed and stable  Complications: No apparent anesthesia complications

## 2012-12-26 NOTE — Anesthesia Postprocedure Evaluation (Signed)
  Anesthesia Post-op Note  Patient: Tonya Torres  Procedure(s) Performed: Procedure(s): LAPAROSCOPIC ASSISTED VAGINAL HYSTERECTOMY (N/A)  Patient Location: [facility]'s unit  Anesthesia Type:General  Level of Consciousness: awake, alert  and oriented  Airway and Oxygen Therapy: Patient Spontanous Breathing and Patient connected to nasal cannula oxygen  Post-op Pain: none  Post-op Assessment: Post-op Vital signs reviewed and Patient's Cardiovascular Status Stable  Post-op Vital Signs: Reviewed and stable  Complications: No apparent anesthesia complications

## 2012-12-26 NOTE — Addendum Note (Signed)
Addendum created 12/26/12 1333 by Turner Daniels, CRNA   Modules edited: Anesthesia LDA

## 2012-12-26 NOTE — Op Note (Signed)
Patient name  Tonya Torres, [place] ZOXWRUEAV#[id number] CSN# [id number]  [facility], MD 12/26/2012 9:20 AM

## 2012-12-26 NOTE — Op Note (Signed)
Tonya Torres, Tonya Torres NO.:  [id number]  MEDICAL RECORD NO.:  [id number]  LOCATION:  [facility]                          FACILITY:  [facility]  PHYSICIAN:  Juluis Mire, M.D.   DATE OF BIRTH:  Jun 20, 1965  DATE OF PROCEDURE:  12/26/2012 DATE OF DISCHARGE:                              OPERATIVE REPORT   PREOP DIAGNOSES:  Pelvic pain.  Known uterine fibroids.  POSTOPERATIVE DIAGNOSES:  Pelvic pain.  Known uterine fibroids.  Left- sided endometrioma.  Extensive abdominal pelvic adhesions.  OPERATIVE PROCEDURE:  Open laparoscopy with lysis of adhesions.  We did a left ovarian cystectomy, drained endometrioma.  Subsequently we did a laparoscopic-assisted vaginal hysterectomy with removal of left tube and ovary and right fallopian tube.  SURGEON:  Juluis Mire, MD  ASSISTANT:  Stann Mainland. Grewal, MD  ANESTHESIA:  General endotracheal.  ESTIMATED BLOOD LOSS:  250 mL.  PACKS:  None.  DRAINS:  Included urethral Foley.  INTRAOPERATIVE BLOOD PLACED:  None.  COMPLICATION:  None.  INDICATION:  Dictated in history and physical.  DESCRIPTION OF PROCEDURE:  The patient was taken to OR, placed in supine position.  After satisfactory level of general endotracheal anesthesia was obtained, the patient was placed in dorsal lithotomy position using the Allen stirrups.  The abdomen, perineum, and vagina were prepped out with Betadine.  Bladder was emptied by in and out catheterization. Hulka tenaculum was put in place and secured.  The patient was draped in sterile field.  Subumbilical incision made with a knife and carried through subcutaneous tissue.  Fascia was entered sharply.  Peritoneum was entered with blunt finger pressure.  The open laparoscopic trocar was put in place.  The abdomen was insufflated with carbon dioxide. Laparoscope was introduced.  There was no evidence of injury to adjacent organs.  A 5-mm trocar was put in place in suprapubic area under  direct visualization.  Uterus is elevated.  Initially on the left side, I could not tell whether it was a ovary or possibility of a pedunculated fibroid.  After close evaluation, it was obvious that she had a left- sided endometrioma.  There were adhesions from the colon and pelvic sidewall and omentum.  We put in a 2nd 5 mm trocar in the left lower quadrant after visualizing the epigastric vessels.  We drained endometrioma.  We then were able to elevate the ovary.  Using blunt and sharp dissection, we were able to dissect it free from the omentum, colon, and pelvic sidewall.  We had good elevation of the ovary and good visualization of the ovarian vasculature.  Using the EnSeal, the ovarian vasculature was cauterized, incised.  Mesenteric attachments of the left tube and ovary cauterized, incised up to the round ligament.  The round ligament was cauterized and incised.  We then went to the right side. On the right side, the ovary and tube were unremarkable.  We first removed the right fallopian tube.  Using the EnSeal, it was placed in the cul-de-sac.  Next the right utero-ovarian pedicle was cauterized, incised as well as the right round ligament.  We had good freeing of the uterus.  At this point in time, decision was to  go vaginally.  Abdomen was deflated with carbon dioxide.  Laparoscope had been removed.  Hulka tenaculum was then removed.  The patient's legs were repositioned. A weighted speculum was placed in vaginal vault.  Cul-de-sac was entered sharply.  Both uterosacral ligaments were clamped, cut, and suture ligated with 0 Vicryl.  Reflection of vaginal mucosa anteriorly was incised.  The bladder was dissected superiorly.  Paracervical tissue was then clamped, cut, and suture ligated with 0 Vicryl.  Using clamp cut and tie technique with suture ligature of 0 Vicryl, the parametrium was serially separated from the side of the uterus.  Vesicouterine space was identified.   Uterus was then flipped.  Remaining pedicle was clamped and cut.  Held pedicles were secured with free ties of 0 Vicryl.  We then did see the right fallopian tube, so pathology was the uterus, left tube, and ovary and right fallopian tube.  At this point in time, cul-de- sac was run through an running locking suture of 0 Vicryl.  Vaginal mucosa reapproximated with interrupted figure-of-eight 2-0 Monocryl.  Cystoscopy had been performed.  The patient had been given methylene blue.  We saw blue tinged urine coming from both ureteral orifices and the bladder was intact.  Cystoscope was then removed.  The Foley was placed to straight drain.  The patient's legs were repositioned.  Laparoscope was reintroduced. Visualization revealed some bleeding from where the left ovary had been removed.  Using the bipolar, we brought about hemostasis to that area. There was little bit of oozing from the right ovary, also brought under control with the bipolar.  The area was hemostatically intact.  We thoroughly irrigated the pelvis.  We deflated the abdomen and reinflated, still had a little oozing from the left ovarian vasculature. Again, we cauterized this area.  We then deinsufflated and reinflated. There was no further bleeding noted.  Removed all irrigation.  The abdomen was deflated with carbon dioxide.  All trocars removed. Subumbilical fascia closed with figure-of-eight of 0 Vicryl.  Skin was closed with interrupted subcuticular sutures of 4-0 Vicryl.  Suprapubic incision was closed with Dermabond.  Sponge, instrument, and needle count was reported correct by circulating nurse multiple times.  The patient tolerated the procedure very well and was extubated to recovery in good condition.     Juluis Mire, M.D.     JSM/MEDQ  D:  12/26/2012  T:  12/26/2012  Job:  [id number]

## 2012-12-26 NOTE — Brief Op Note (Signed)
12/26/2012  9:18 AM  PATIENT:  Tonya Torres  47 y.o. female  PRE-OPERATIVE DIAGNOSIS:  pelvic pain, fibroids  POST-OPERATIVE DIAGNOSIS:  pelvic fibroids  PROCEDURE:  Procedure(s): LAPAROSCOPIC ASSISTED VAGINAL HYSTERECTOMY (N/A)  SURGEON:  Surgeon(s) and Role:    * Juluis Mire, MD - Primary  PHYSICIAN ASSISTANT:   ASSISTANTS: grewal   ANESTHESIA:   local and general  EBL:     BLOOD ADMINISTERED:none  DRAINS: Urinary Catheter (Foley)   LOCAL MEDICATIONS USED:  MARCAINE     SPECIMEN:  Source of Specimen:  uterus left tube and ovary righ fallopian tube  DISPOSITION OF SPECIMEN:  PATHOLOGY  COUNTS:  YES  TOURNIQUET:  * No tourniquets in log *  DICTATION: .Other Dictation: Dictation Number [id number]  PLAN OF CARE: Admit for overnight observation  PATIENT DISPOSITION:  PACU - hemodynamically stable.   Delay start of Pharmacological VTE agent (>24hrs) due to surgical blood loss or risk of bleeding: no

## 2012-12-26 NOTE — Anesthesia Postprocedure Evaluation (Signed)
  Anesthesia Post Note  Patient: Tonya Torres  Procedure(s) Performed: Procedure(s) (LRB): LAPAROSCOPIC ASSISTED VAGINAL HYSTERECTOMY (N/A)  Anesthesia type: GA  Patient location: PACU  Post pain: Pain level controlled  Post assessment: Post-op Vital signs reviewed  Last Vitals:  Filed Vitals:   12/26/12 0557  BP: 132/81  Pulse: 75  Temp: 36.5 C  Resp: 20    Post vital signs: Reviewed  Level of consciousness: sedated  Complications: No apparent anesthesia complications

## 2012-12-26 NOTE — Preoperative (Signed)
Beta Blockers   Reason not to administer Beta Blockers:Not Applicable 

## 2012-12-26 NOTE — H&P (Signed)
  History and physical exam unchanged 

## 2012-12-27 ENCOUNTER — Encounter (HOSPITAL_COMMUNITY): Payer: Self-pay | Admitting: Obstetrics and Gynecology

## 2012-12-27 LAB — CBC
HCT: 31.1 % — ABNORMAL LOW (ref 36.0–46.0)
MCH: 27.5 pg (ref 26.0–34.0)
MCV: 82.3 fL (ref 78.0–100.0)
Platelets: 212 10*3/uL (ref 150–400)
RDW: 13.1 % (ref 11.5–15.5)
WBC: 11 10*3/uL — ABNORMAL HIGH (ref 4.0–10.5)

## 2012-12-27 MED ORDER — OXYCODONE-ACETAMINOPHEN 7.5-325 MG PO TABS: 1.0000 | ORAL_TABLET | ORAL | Status: DC | PRN

## 2012-12-27 NOTE — Progress Notes (Signed)
1 Day Post-Op Procedure(s) (LRB): LAPAROSCOPIC ASSISTED VAGINAL HYSTERECTOMY (N/A)  Subjective: Patient reports tolerating PO and no problems voiding.    Objective: I have reviewed patient's vital signs and labs.  General: alert GI: soft, non-tender; bowel sounds normal; no masses,  no organomegaly Vaginal Bleeding: minimal  Assessment: s/p Procedure(s): LAPAROSCOPIC ASSISTED VAGINAL HYSTERECTOMY (N/A): stable  Plan: Discharge home  LOS: 1 day    Tonya Torres S 12/27/2012, 8:36 AM

## 2012-12-27 NOTE — Discharge Summary (Signed)
  Patient name  Tonya Torres, [place] DICTATION#  [id number] CSN# [id number]  [facility], MD 12/27/2012 8:40 AM

## 2012-12-27 NOTE — Discharge Summary (Signed)
[name], Tonya Torres NO.:  [id number]  MEDICAL RECORD NO.:  [id number]  LOCATION:  [id number]                          [facility]ACILITY:  [facility]  PHYSICIAN:  Juluis Mire, M.D.   DATE OF BIRTH:  April 09, 1965  DATE OF ADMISSION:  12/26/2012 DATE OF DISCHARGE:  12/27/2012                              DISCHARGE SUMMARY   ADMITTING DIAGNOSIS:  Pelvic pain secondary to uterine fibroids.  DISCHARGE DIAGNOSIS:  Pelvic pain secondary to uterine fibroids with the addition of pelvic endometriosis, with left-sided endometrioma and pelvic adhesions.  OPERATIVE PROCEDURE:  Laparoscopic-assisted vaginal hysterectomy with removal of left tube, ovary, and right tube.  We also performed cystoscopy.  For complete history and physical, please see dictated note.  COURSE IN THE HOSPITAL:  The patient underwent above-noted surgery.  As we got in laparoscopically, we realized she did have a large left-sided endometrioma.  This was drained, and we were able to dissect it free and remove it along with the ovary.  The right ovary looked normal.  We left in place.  The tube was removed.  Areas of endometriosis were minimal in nature after at this point in time.  Postoperatively, she did well.  Her postop hemoglobin was 10.4.  She was discharged home on her first postop day.  PHYSICAL EXAMINATION:  VITAL SIGNS:  At that time, she was afebrile. Stable vital signs. ABDOMEN:  Soft.  Bowel sounds were active.  All incisions were clear. Foley had just been discontinued.  She was having minimal bleeding.  She was ambulating without difficulty.  In terms of complication none encountered during the stay in hospital. The patient was discharged home in stable condition.  DISPOSITION:  The patient is to avoid heavy lifting, vaginal entry, or driving a car.  She is to call should there be signs of infection, active vaginal bleeding, nausea, vomiting, or excessive pain.  Also instructed on signs and symptoms  of deep venous thrombosis and pulmonary embolus.  Discharged home with Percocet as needed for pain.  Office will call her tomorrow and arrange follow up.     Juluis Mire, M.D.     JSM/MEDQ  D:  12/27/2012  T:  12/27/2012  Job:  [id number]

## 2013-11-08 ENCOUNTER — Other Ambulatory Visit: Payer: Self-pay | Admitting: Obstetrics and Gynecology

## 2013-11-09 LAB — CYTOLOGY - PAP

## 2014-01-31 ENCOUNTER — Ambulatory Visit (INDEPENDENT_AMBULATORY_CARE_PROVIDER_SITE_OTHER): Payer: BC Managed Care – PPO | Admitting: Physician Assistant

## 2014-01-31 ENCOUNTER — Encounter: Payer: Self-pay | Admitting: Physician Assistant

## 2014-01-31 VITALS — BP 127/87 | HR 67 | Temp 97.0°F | Ht 66.0 in | Wt 205.4 lb

## 2014-01-31 DIAGNOSIS — H8110 Benign paroxysmal vertigo, unspecified ear: Secondary | ICD-10-CM

## 2014-01-31 MED ORDER — MECLIZINE HCL 32 MG PO TABS: 32.0000 mg | ORAL_TABLET | Freq: Three times a day (TID) | ORAL | Status: DC | PRN

## 2014-01-31 NOTE — Patient Instructions (Signed)

## 2014-01-31 NOTE — Progress Notes (Signed)
Subjective:     Patient ID: Tonya Torres, female   DOB: 1965/10/23, 48 y.o.   MRN: [id number]  HPI Pt states she awoke yesterday with the room spinning She has sx all day with assoc nausea Sx have improved today No hx of same Denies any prev head injury  Review of Systems  Constitutional: Negative for fever, chills, activity change, appetite change and fatigue.  HENT: Negative for congestion, ear pain, facial swelling, postnasal drip, rhinorrhea, sinus pressure, sneezing, sore throat and tinnitus.   Respiratory: Negative for chest tightness.   Cardiovascular: Negative for chest pain.       Objective:   Physical Exam  Constitutional: She appears well-developed and well-nourished.  HENT:  Right Ear: External ear normal.  Left Ear: External ear normal.  Mouth/Throat: Oropharynx is clear and moist.  TM's with nl landmarks bilat  Neck: Neck supple. No JVD present.  Lymphadenopathy:    She has no cervical adenopathy.  Nursing note and vitals reviewed. PERRLA EOMI CN 2-12 intact Finger to nose and propiocep. Good Gait nl Heel toe walk good Rhomberg neg Pulses/strength/sensory good and equal int he upper ext     Assessment:     Vertigo    Plan:     Antivert 25mg  1 po tid prn Nl course reviewed with pt Hydrate well F/U prn

## 2014-02-13 ENCOUNTER — Encounter (INDEPENDENT_AMBULATORY_CARE_PROVIDER_SITE_OTHER): Payer: Self-pay

## 2014-02-13 ENCOUNTER — Encounter: Payer: Self-pay | Admitting: Family Medicine

## 2014-02-13 ENCOUNTER — Ambulatory Visit (INDEPENDENT_AMBULATORY_CARE_PROVIDER_SITE_OTHER): Payer: BC Managed Care – PPO | Admitting: Family Medicine

## 2014-02-13 VITALS — BP 127/79 | HR 91 | Temp 97.1°F | Ht 66.0 in | Wt 205.0 lb

## 2014-02-13 DIAGNOSIS — T83511A Infection and inflammatory reaction due to indwelling urethral catheter, initial encounter: Secondary | ICD-10-CM

## 2014-02-13 DIAGNOSIS — J029 Acute pharyngitis, unspecified: Secondary | ICD-10-CM

## 2014-02-13 DIAGNOSIS — N390 Urinary tract infection, site not specified: Secondary | ICD-10-CM

## 2014-02-13 DIAGNOSIS — R339 Retention of urine, unspecified: Secondary | ICD-10-CM

## 2014-02-13 LAB — POCT UA - MICROSCOPIC ONLY
CASTS, UR, LPF, POC: NEGATIVE
Crystals, Ur, HPF, POC: NEGATIVE
Mucus, UA: NEGATIVE
Yeast, UA: NEGATIVE

## 2014-02-13 LAB — POCT URINALYSIS DIPSTICK
Bilirubin, UA: NEGATIVE
Glucose, UA: NEGATIVE
Ketones, UA: NEGATIVE
NITRITE UA: NEGATIVE
PROTEIN UA: NEGATIVE
Spec Grav, UA: <=1.005
Urobilinogen, UA: NEGATIVE
pH, UA: 5

## 2014-02-13 LAB — POCT RAPID STREP A (OFFICE): Rapid Strep A Screen: NEGATIVE

## 2014-02-13 NOTE — Progress Notes (Signed)
   Subjective:    Patient ID: Tonya Torres, female    DOB: Jan 03, 1966, 49 y.o.   MRN: [id number]  HPI 49 year old female with one-day history of head congestion drainage. She may have had some fever and chills last night. Throat is sore. There is no significant cough. She has also had some urinary retention, but this could be related to the fact that she is not drinking enough fluids.  There is a concern about possibility of strep.    Review of Systems  Constitutional: Positive for fever.  HENT: Positive for sore throat.   Respiratory: Negative.   Cardiovascular: Negative.   Genitourinary: Positive for difficulty urinating.       Objective:   Physical Exam  Constitutional: She appears well-developed.  HENT:  Right Ear: External ear normal.  Left Ear: External ear normal.  Small amount of exudate and right tonsillar area  Pulmonary/Chest: Effort normal and breath sounds normal.  Abdominal: Soft.          Assessment & Plan:  1. Sore throat Suspect viral - POCT rapid strep A  2. Urinary tract infection associated with catheterization of urinary tract, initial encounter  Do urine C&S and begin Cipro - POCT urinalysis dipstick - POCT UA - Microscopic Only - Urine culture  Wardell Honour MD

## 2014-02-15 LAB — URINE CULTURE

## 2014-12-05 ENCOUNTER — Other Ambulatory Visit: Payer: Self-pay | Admitting: Obstetrics and Gynecology

## 2014-12-05 DIAGNOSIS — R928 Other abnormal and inconclusive findings on diagnostic imaging of breast: Secondary | ICD-10-CM

## 2014-12-11 ENCOUNTER — Other Ambulatory Visit: Payer: Self-pay | Admitting: Obstetrics and Gynecology

## 2014-12-11 ENCOUNTER — Ambulatory Visit
Admission: RE | Admit: 2014-12-11 | Discharge: 2014-12-11 | Disposition: A | Discharge status: Home / Self Care | Payer: BC Managed Care – PPO | Source: Ambulatory Visit | Attending: Obstetrics and Gynecology | Admitting: Obstetrics and Gynecology

## 2014-12-11 DIAGNOSIS — N6310 Unspecified lump in the right breast, unspecified quadrant: Secondary | ICD-10-CM

## 2014-12-11 DIAGNOSIS — R928 Other abnormal and inconclusive findings on diagnostic imaging of breast: Secondary | ICD-10-CM

## 2014-12-18 ENCOUNTER — Other Ambulatory Visit: Payer: Self-pay | Admitting: Obstetrics and Gynecology

## 2014-12-18 ENCOUNTER — Other Ambulatory Visit: Payer: Self-pay | Admitting: Physician Assistant

## 2014-12-18 ENCOUNTER — Ambulatory Visit
Admission: RE | Admit: 2014-12-18 | Discharge: 2014-12-18 | Disposition: A | Discharge status: Home / Self Care | Payer: BC Managed Care – PPO | Source: Ambulatory Visit | Attending: Obstetrics and Gynecology | Admitting: Obstetrics and Gynecology

## 2014-12-18 DIAGNOSIS — D225 Melanocytic nevi of trunk: Secondary | ICD-10-CM

## 2014-12-18 DIAGNOSIS — N6310 Unspecified lump in the right breast, unspecified quadrant: Secondary | ICD-10-CM

## 2014-12-18 HISTORY — DX: Melanocytic nevi of trunk: D22.5

## 2014-12-31 ENCOUNTER — Other Ambulatory Visit: Payer: Self-pay | Admitting: Gastroenterology

## 2015-01-07 ENCOUNTER — Other Ambulatory Visit: Payer: Self-pay | Admitting: Internal Medicine

## 2015-01-07 DIAGNOSIS — R921 Mammographic calcification found on diagnostic imaging of breast: Secondary | ICD-10-CM

## 2015-01-29 ENCOUNTER — Other Ambulatory Visit: Payer: Self-pay | Admitting: Physician Assistant

## 2015-01-29 DIAGNOSIS — D229 Melanocytic nevi, unspecified: Secondary | ICD-10-CM

## 2015-01-29 HISTORY — DX: Melanocytic nevi, unspecified: D22.9

## 2017-03-05 IMAGING — US US BREAST LTD UNI RIGHT INC AXILLA
1 series · 13 of 13 positions shown · non-contrast
Comparison: Previous examinations, including the screening

CLINICAL DATA: Possible mass in the anterior aspect of the medial
right breast on a recent 3D screening mammogram.

EXAM:
DIGITAL DIAGNOSTIC RIGHT MAMMOGRAM WITH 3D TOMOSYNTHESIS
ULTRASOUND RIGHT BREAST

[Series 1: us breast ltd uni right inc axilla · 0.07mm/px · 13 of 13 slices shown]
[im 1/13]
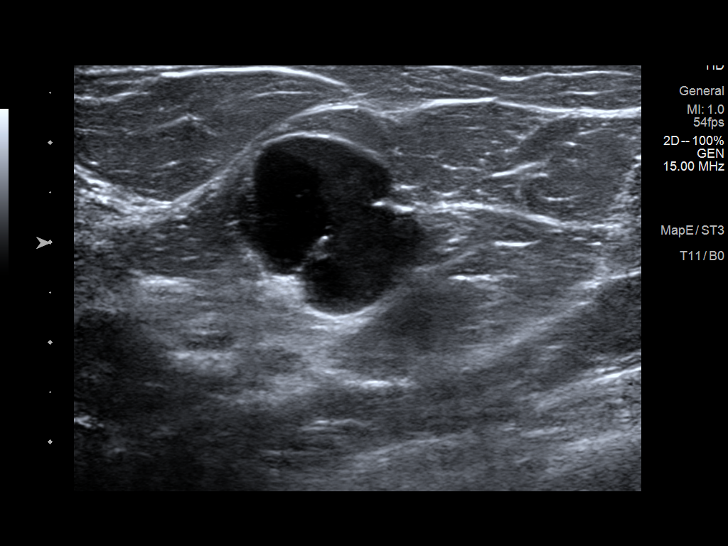
[im 2/13]
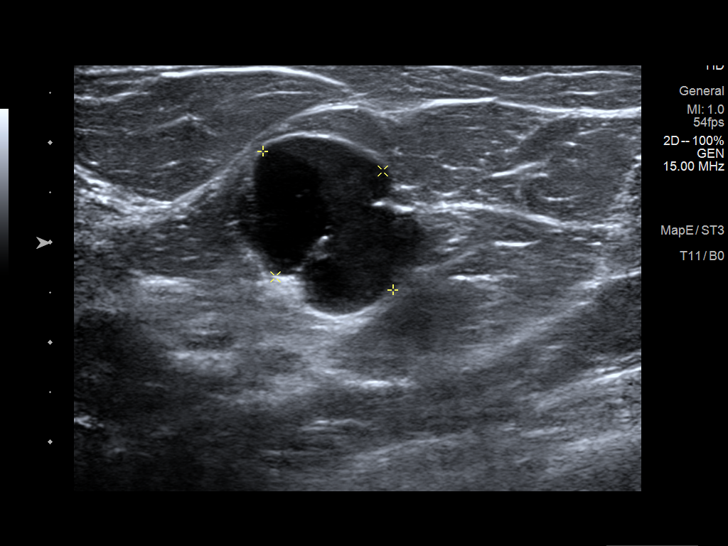
[im 3/13]
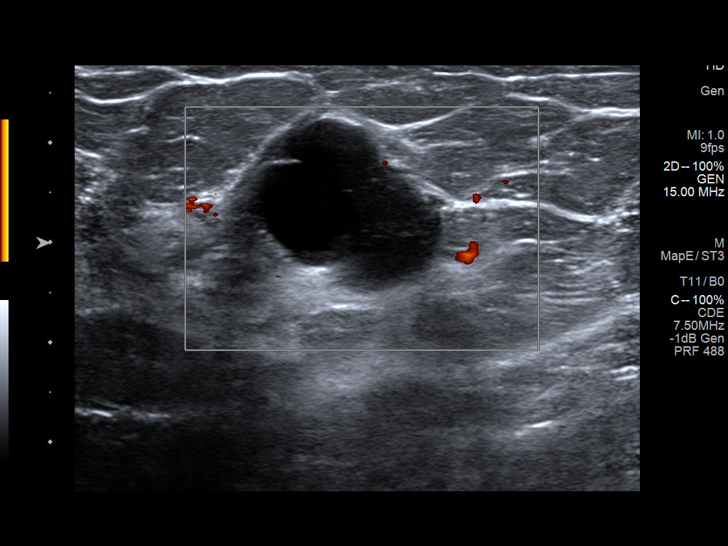
[im 4/13]
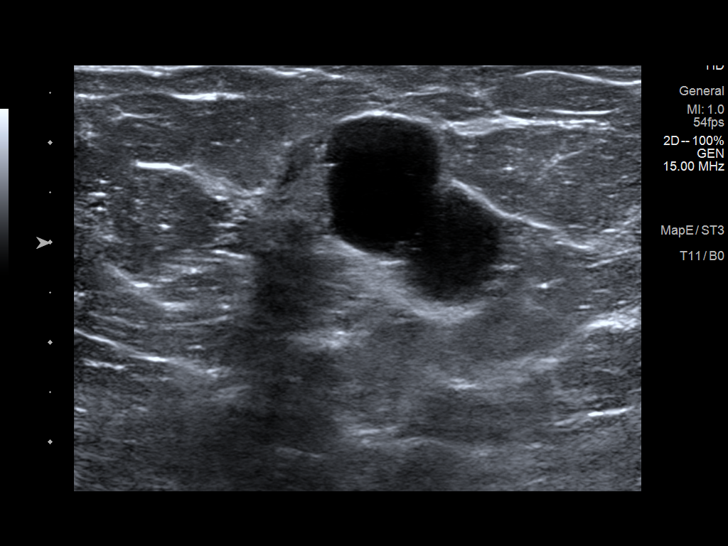
[im 5/13]
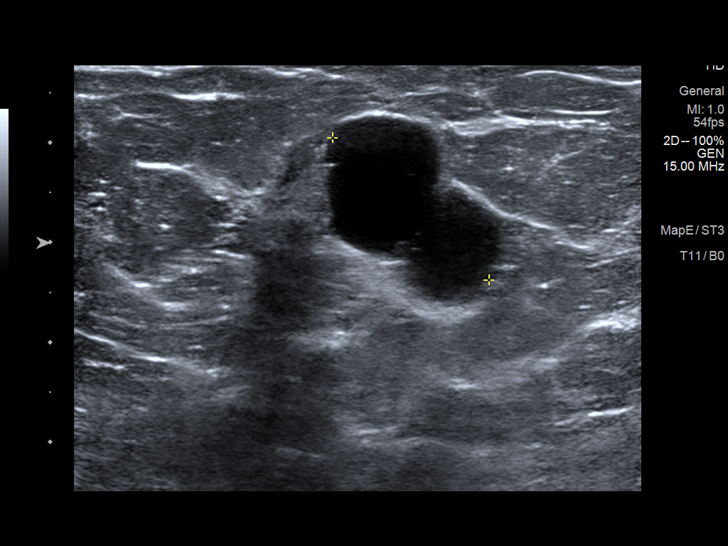
[im 6/13]
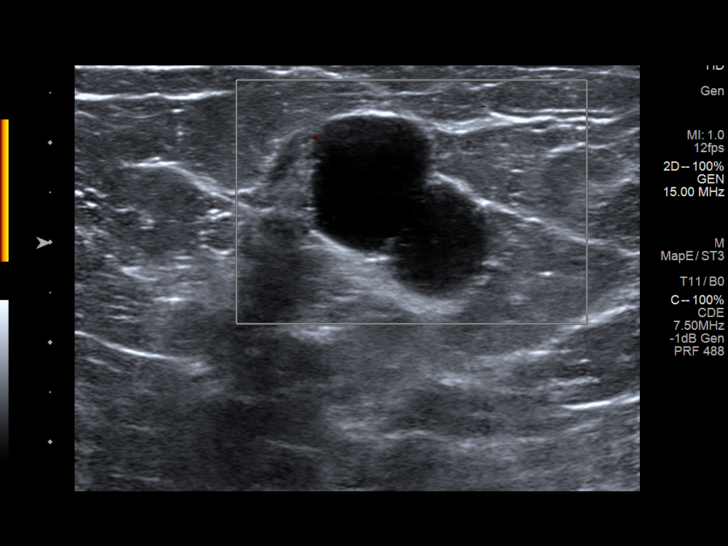
[im 7/13]
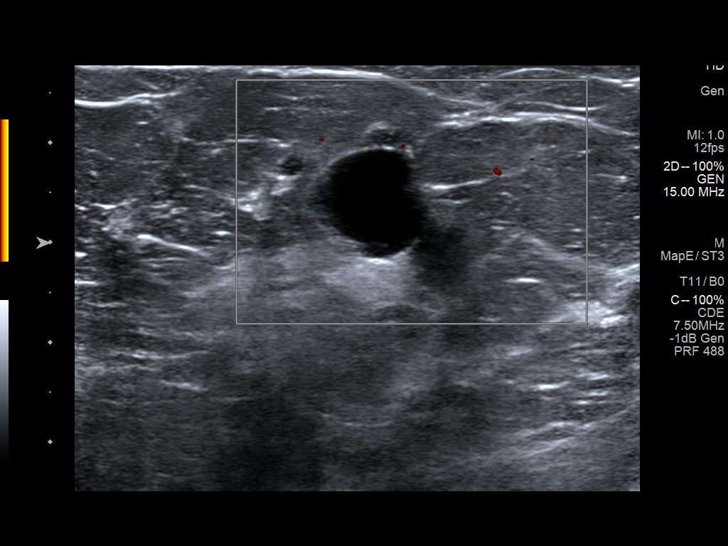
[im 8/13]
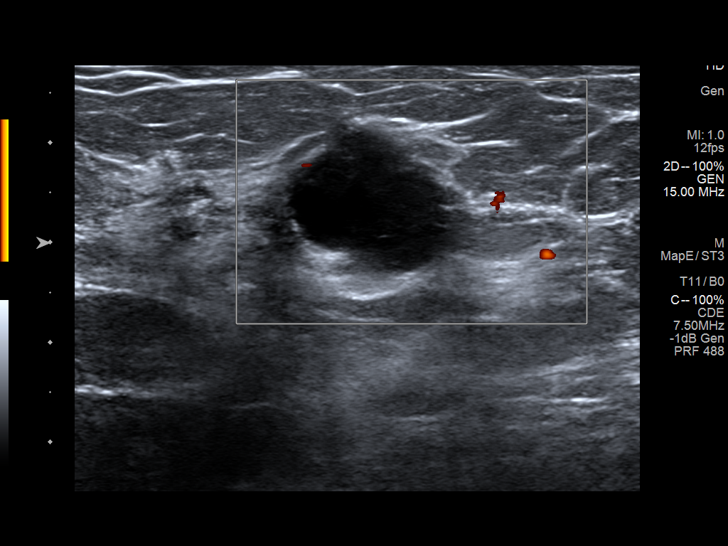
[im 9/13]
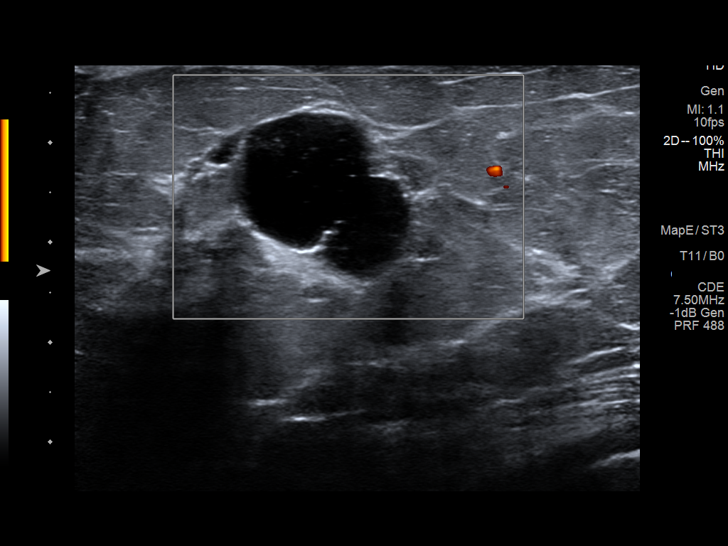
[im 10/13]
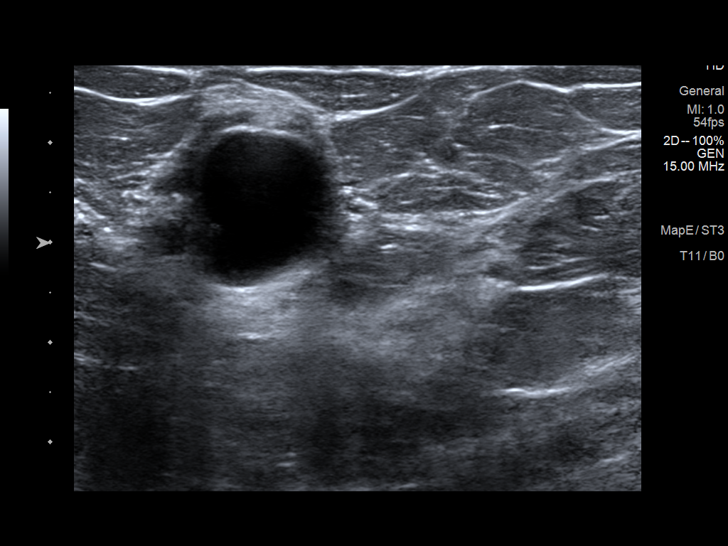
[im 11/13]
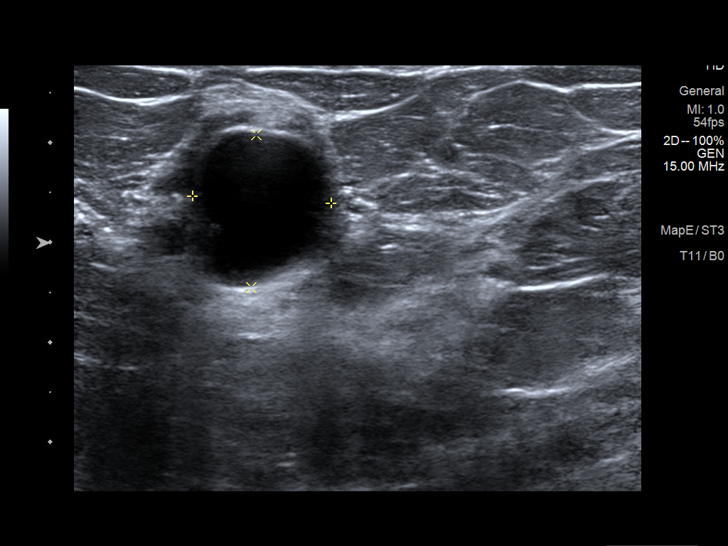
[im 12/13]
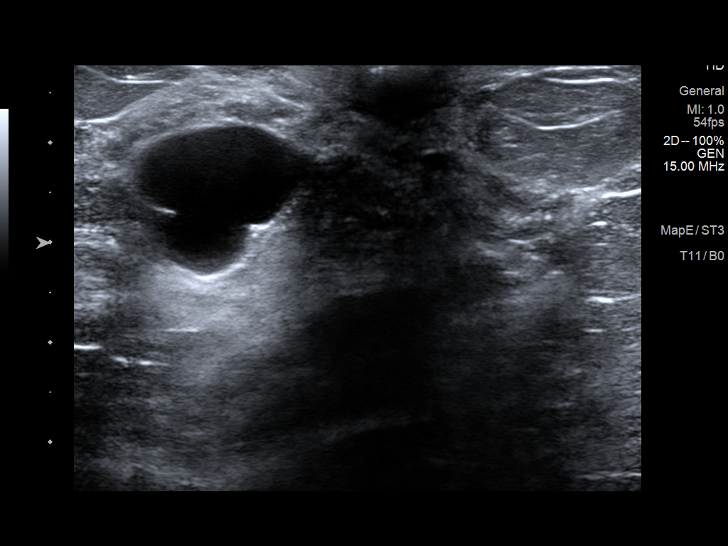
[im 13/13]
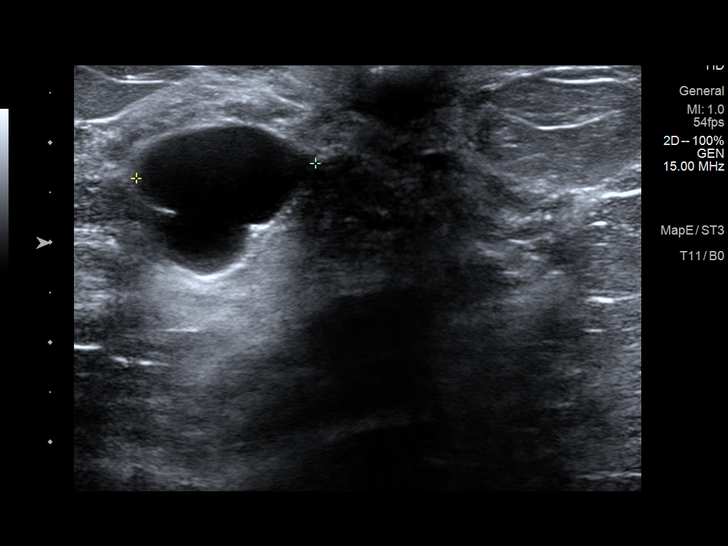

[13 of 13 positions shown; findings below may reference images not displayed]

ACR Breast Density Category b: There are scattered areas of
fibroglandular density.
FINDINGS: Spot compression 3D tomographic images of the right breast confirm
an oval, circumscribed in the anterior aspect of the medial right
breast. There is a more rounded, similar-appearing mass in the retro
nipple region.

On physical exam, no mass is palpable in the medial right breast or
retronipple region.

Targeted ultrasound is performed, showing a 2.1 x 1.9 x 1.5 cm
bilobed cyst in the 2 o'clock position of the right breast, 2 cm
from the nipple. This contains an eccentric area of mildly increased
a echogenicity. No internal blood flow was seen with power Doppler.

In the retronipple region of the right breast, a 1.8 x 1.5 x 1.4 cm
cyst is demonstrated.
IMPRESSION: Right breast cysts, as described above. The cyst in the 2 o'clock
position has an eccentric area of mildly increased echogenicity.
This most likely represents coalesced debris. A soft tissue
component is much less likely.

RECOMMENDATION:
Ultrasound-guided cyst aspiration of the cyst in the 2 o'clock
position of the left breast, to exclude a solid component. If there
is a solid component, this will require ultrasound-guided core
needle biopsy at that time.

I have discussed the findings and recommendations with the patient.
Results were also provided in writing at the conclusion of the
visit. If applicable, a reminder letter will be sent to the patient
regarding the next appointment.

BI-RADS CATEGORY  4: Suspicious.

## 2017-03-12 IMAGING — US US RT BREAST BX
1 series · 2 of 2 positions shown · non-contrast
Comparison: Previous exams.

CLINICAL DATA: 49-year-old female presenting for ultrasound-guided
aspiration of a complicated cyst in the right breast.

EXAM:
ULTRASOUND GUIDED RIGHT BREAST CYST ASPIRATION

[Series 1: advbreast · 2 of 2 slices shown]
[im 1/2]
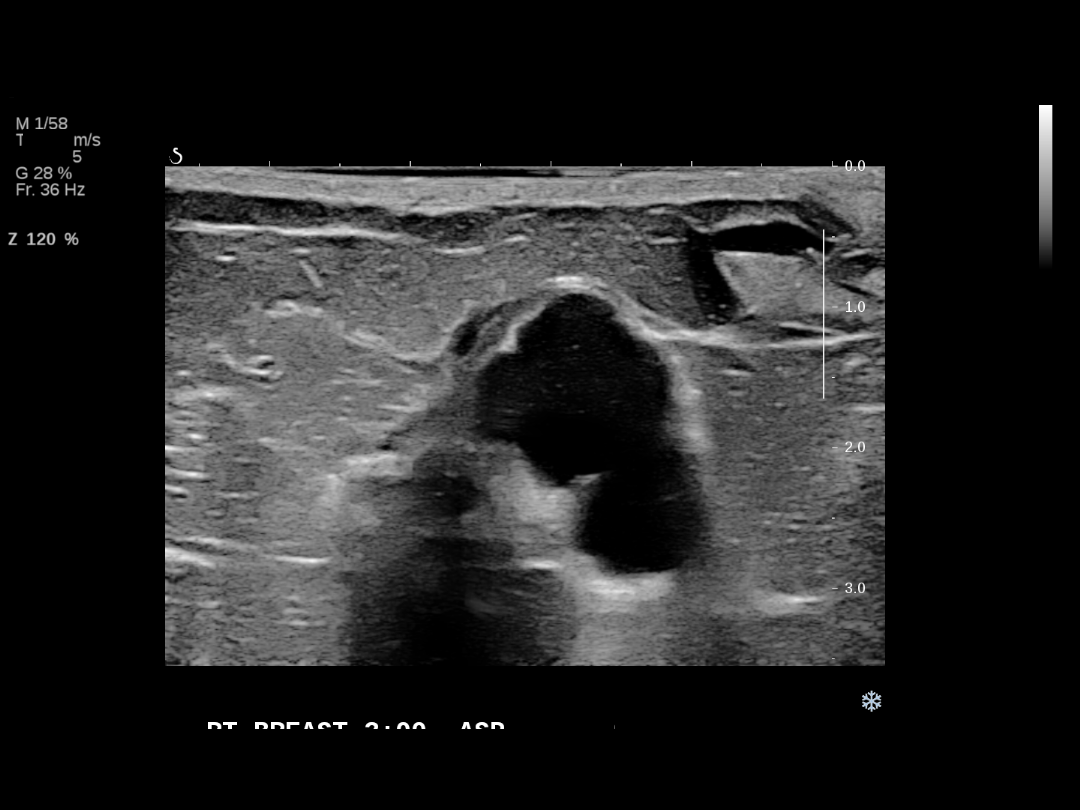
[im 2/2]
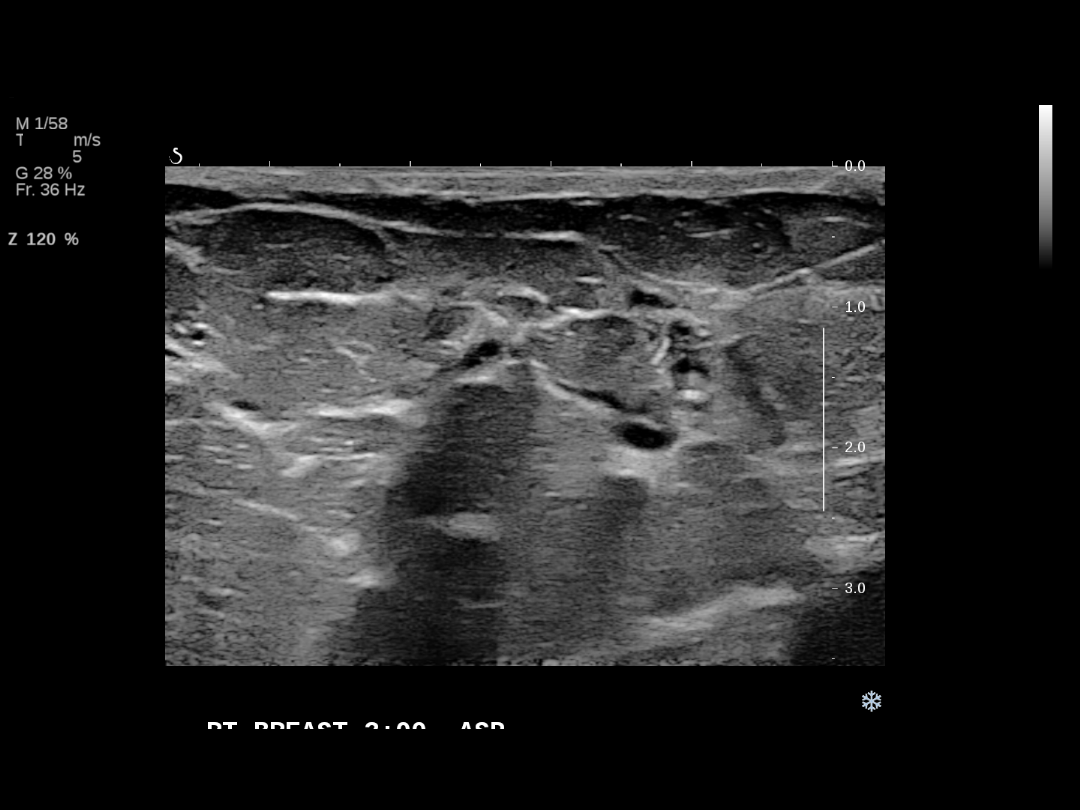

[2 of 2 positions shown; findings below may reference images not displayed]

PROCEDURE:
Using sterile technique, 1% lidocaine, under direct ultrasound
visualization, needle aspiration of complicated cyst in the right
breast at 2 o'clock was performed. Approximately 7 mm of brownish
green fluid was aspirated and discarded. The cyst collapsed
completely following aspiration.
IMPRESSION: Ultrasound-guided aspiration of a complicated cysts in the right
breast at 2 o'clock No apparent complications.

RECOMMENDATIONS:
Return to screening mammography is recommended.

## 2017-11-03 ENCOUNTER — Other Ambulatory Visit: Payer: Self-pay | Admitting: Obstetrics and Gynecology

## 2017-11-03 DIAGNOSIS — N6320 Unspecified lump in the left breast, unspecified quadrant: Secondary | ICD-10-CM

## 2017-11-09 ENCOUNTER — Other Ambulatory Visit: Payer: BC Managed Care – PPO

## 2017-11-11 ENCOUNTER — Ambulatory Visit
Admission: RE | Admit: 2017-11-11 | Discharge: 2017-11-11 | Disposition: A | Discharge status: Home / Self Care | Payer: BC Managed Care – PPO | Source: Ambulatory Visit | Attending: Obstetrics and Gynecology | Admitting: Obstetrics and Gynecology

## 2017-11-11 DIAGNOSIS — N6320 Unspecified lump in the left breast, unspecified quadrant: Secondary | ICD-10-CM

## 2018-03-09 ENCOUNTER — Other Ambulatory Visit: Payer: Self-pay | Admitting: Physician Assistant

## 2018-09-27 ENCOUNTER — Other Ambulatory Visit: Payer: Self-pay | Admitting: Physician Assistant

## 2018-12-09 ENCOUNTER — Other Ambulatory Visit: Payer: Self-pay

## 2018-12-09 DIAGNOSIS — Z20822 Contact with and (suspected) exposure to covid-19: Secondary | ICD-10-CM

## 2018-12-10 LAB — NOVEL CORONAVIRUS, NAA: SARS-CoV-2, NAA: NOT DETECTED

## 2018-12-20 ENCOUNTER — Telehealth: Payer: Self-pay

## 2018-12-20 NOTE — Telephone Encounter (Signed)
NOTES ON FILE FROM DR [facility] [phone], SENT REFERRAL TO [place]

## 2019-01-18 NOTE — Progress Notes (Signed)
CARDIOLOGY CONSULT NOTE       Patient ID: Tonya Torres MRN: [id number] DOB/AGE: 1965-07-17 53 y.o.  Admit date: (Not on file) Referring Physician: Arvella Nigh Primary Physician: No primary care provider on file. Primary Cardiologist: New Reason for Consultation: Palpitations  Active Problems:   * No active hospital problems. *   HPI:  53 y.o. referred by Dr Radene Knee for irregular heart beat and palpitations This is her OB/GYN doctor Note from 12/19/18 indicated she had heart pounding or an irregular beat when she takes her pulse Been going on for 2 weeks She saw heart doctor for similar 10 years ago with nothing abnormal found Sees Dr Oletta Lamas with polyps found in 2016 and f/u colonoscopy last year ok   Some stress lately with mother being in hospice and only daughter getting married No stimulants coffee or excess ETOH No history of thyroid issues She has not had blood work in a year and really only sees her OB/GYN no primary   Palpitations better last 2 weeks Mostly at night when she tries to sleep Nothing sustained just a few skips/hard beats in a row No associated dyspnea, chest pain or syncope   ROS All other systems reviewed and negative except as noted above  Past Medical History:  Diagnosis Date  . Atypical nevus 01/29/2015   mild atypia - between breast  . 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 Medical history non-contributory     No family history on file.  Social History   Socioeconomic History  . Marital status: Married    Spouse name: Not on file  . Number of children: Not on file  . Years of education: Not on file  . Highest education level: Not on file  Occupational History  . Not on file  Tobacco Use  . Smoking status: Never Smoker  Substance and Sexual Activity  . Alcohol use: No  . Drug use: No  . Sexual activity: Not on file  Other Topics Concern  . Not on file  Social History  Narrative  . Not on file   Social Determinants of Health   Financial Resource Strain:   . Difficulty of Paying Living Expenses: Not on file  Food Insecurity:   . Worried About Charity fundraiser in the Last Year: Not on file  . Ran Out of Food in the Last Year: Not on file  Transportation Needs:   . Lack of Transportation (Medical): Not on file  . Lack of Transportation (Non-Medical): Not on file  Physical Activity:   . Days of Exercise per Week: Not on file  . Minutes of Exercise per Session: Not on file  Stress:   . Feeling of Stress : Not on file  Social Connections:   . Frequency of Communication with Friends and Family: Not on file  . Frequency of Social Gatherings with Friends and Family: Not on file  . Attends Religious Services: Not on file  . Active Member of Clubs or Organizations: Not on file  . Attends Archivist Meetings: Not on file  . Marital Status: Not on file  Intimate Partner Violence:   . Fear of Current or Ex-Partner: Not on file  . Emotionally Abused: Not on file  . Physically Abused: Not on file  . Sexually Abused: Not on file    Past Surgical History:  Procedure Laterality Date  . BREAST BIOPSY    . ENDOMETRIAL ABLATION    .  LAPAROSCOPIC ASSISTED VAGINAL HYSTERECTOMY N/A 12/26/2012   Procedure: LAPAROSCOPIC ASSISTED VAGINAL HYSTERECTOMY;  Surgeon: Darlyn Chamber, MD;  Location: [facility];  Service: Gynecology;  Laterality: N/A;  . TUBAL LIGATION          Physical Exam: Last menstrual period 12/14/2012.    Affect appropriate Healthy:  appears stated age 22: normal Neck supple with no adenopathy JVP normal no bruits no thyromegaly Lungs clear with no wheezing and good diaphragmatic motion Heart:  S1/S2 no murmur, no rub, gallop or click PMI normal Abdomen: benighn, BS positve, no tenderness, no AAA no bruit.  No HSM or HJR Distal pulses intact with no bruits No edema Neuro non-focal Skin warm and dry No muscular  weakness   Labs:   Lab Results  Component Value Date   WBC 11.0 (H) 12/27/2012   HGB 10.4 (L) 12/27/2012   HCT 31.1 (L) 12/27/2012   MCV 82.3 12/27/2012   PLT 212 12/27/2012     Radiology: No results found.  EKG: not done     ASSESSMENT AND PLAN:   1. Palpitations:  Benign sounding will do event monitor and echo as well as labs including TSH 2. Anemia:  F/u primary history of polyps  H/H today    Signed: Jenkins Rouge 01/18/2019, 3:01 PM

## 2019-01-24 ENCOUNTER — Other Ambulatory Visit: Payer: Self-pay

## 2019-01-24 ENCOUNTER — Encounter: Payer: Self-pay | Admitting: Cardiovascular Disease

## 2019-01-24 ENCOUNTER — Other Ambulatory Visit (HOSPITAL_COMMUNITY)
Admission: RE | Admit: 2019-01-24 | Discharge: 2019-01-24 | Disposition: A | Discharge status: Home / Self Care | Payer: BC Managed Care – PPO | Source: Ambulatory Visit | Attending: Cardiovascular Disease | Admitting: Cardiovascular Disease

## 2019-01-24 ENCOUNTER — Ambulatory Visit: Payer: BC Managed Care – PPO | Admitting: Cardiovascular Disease

## 2019-01-24 VITALS — BP 107/67 | HR 67 | Temp 97.7°F | Ht 66.0 in | Wt 187.0 lb

## 2019-01-24 DIAGNOSIS — R002 Palpitations: Secondary | ICD-10-CM

## 2019-01-24 LAB — BASIC METABOLIC PANEL
Anion gap: 9 (ref 5–15)
BUN: 20 mg/dL (ref 6–20)
CO2: 26 mmol/L (ref 22–32)
Calcium: 9 mg/dL (ref 8.9–10.3)
Chloride: 104 mmol/L (ref 98–111)
Creatinine, Ser: 0.74 mg/dL (ref 0.44–1.00)
GFR calc Af Amer: >60 mL/min (ref >60)
GFR calc non Af Amer: >60 mL/min (ref >60)
Glucose, Bld: 87 mg/dL (ref 70–99)
Potassium: 4 mmol/L (ref 3.5–5.1)
Sodium: 139 mmol/L (ref 135–145)

## 2019-01-24 LAB — HEMOGLOBIN AND HEMATOCRIT, BLOOD
HCT: 37.6 % (ref 36.0–46.0)
Hemoglobin: 11.8 g/dL — ABNORMAL LOW (ref 12.0–15.0)

## 2019-01-24 LAB — TSH: TSH: 2.142 u[IU]/mL (ref 0.350–4.500)

## 2019-01-24 NOTE — Patient Instructions (Addendum)
Medication Instructions:  Your physician recommends that you continue on your current medications as directed. Please refer to the Current Medication list given to you today.   Labwork: Today   Testing/Procedures: Your physician has requested that you have an echocardiogram. Echocardiography is a painless test that uses sound waves to create images of your heart. It provides your doctor with information about the size and shape of your heart and how well your heart's chambers and valves are working. This procedure takes approximately one hour. There are no restrictions for this procedure.  Your physician has recommended that you wear an event monitor. Event monitors are medical devices that record the heart's electrical activity. Doctors most often [place] these monitors to diagnose arrhythmias. Arrhythmias are problems with the speed or rhythm of the heartbeat. The monitor is a small, portable device. You can wear one while you do your normal daily activities. This is usually used to diagnose what is causing palpitations/syncope (passing out).    Follow-Up: Your physician recommends that you schedule a follow-up appointment in: as needed   Any Other Special Instructions Will Be Listed Below (If Applicable).     If you need a refill on your cardiac medications before your next appointment, please call your pharmacy.

## 2019-01-30 ENCOUNTER — Ambulatory Visit (HOSPITAL_COMMUNITY)
Admission: RE | Admit: 2019-01-30 | Discharge: 2019-01-30 | Disposition: A | Discharge status: Home / Self Care | Payer: BC Managed Care – PPO | Source: Ambulatory Visit | Attending: Cardiovascular Disease | Admitting: Cardiovascular Disease

## 2019-01-30 ENCOUNTER — Other Ambulatory Visit: Payer: Self-pay

## 2019-01-30 DIAGNOSIS — R002 Palpitations: Secondary | ICD-10-CM | POA: Diagnosis present

## 2019-01-30 NOTE — Progress Notes (Signed)
*  PRELIMINARY RESULTS* Echocardiogram 2D Echocardiogram has been performed.  Tonya Torres 01/30/2019, 9:12 AM

## 2019-02-23 ENCOUNTER — Ambulatory Visit (INDEPENDENT_AMBULATORY_CARE_PROVIDER_SITE_OTHER): Payer: BC Managed Care – PPO

## 2019-02-23 DIAGNOSIS — R002 Palpitations: Secondary | ICD-10-CM | POA: Diagnosis not present

## 2020-02-04 IMAGING — MG DIGITAL DIAGNOSTIC UNILATERAL LEFT MAMMOGRAM WITH TOMO AND CAD
6 series · 6 of 18 positions shown · non-contrast
Comparison: Previous exam(s).

CLINICAL DATA: Mass felt by the patient in the 12 o'clock position
of the left breast for the past month.

EXAM:
DIGITAL DIAGNOSTIC LEFT MAMMOGRAM WITH CAD AND TOMO
ULTRASOUND LEFT BREAST

[L TAN synth-2D]
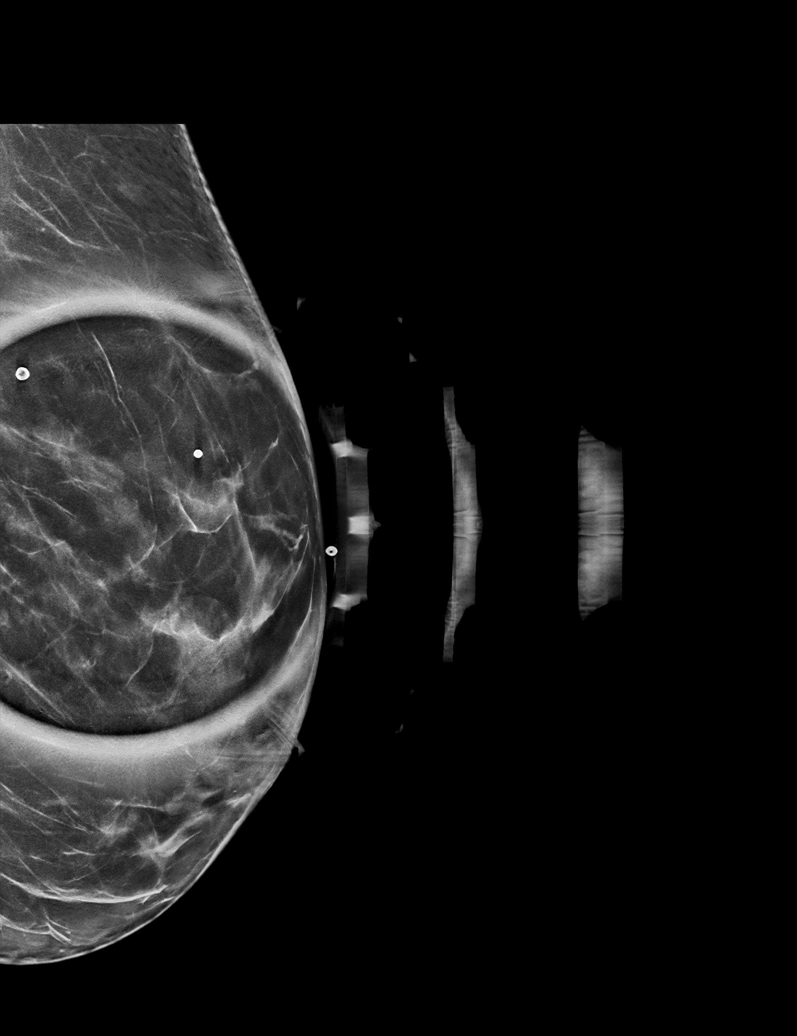

[L CC synth-2D]
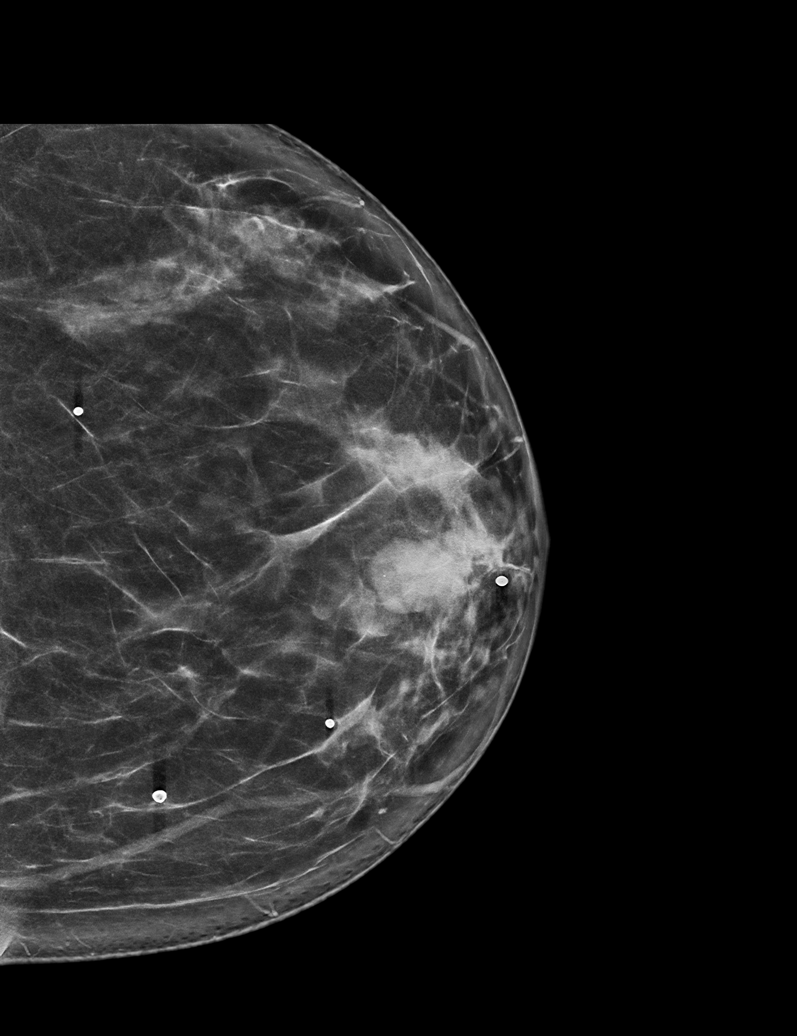

[L MLO synth-2D]
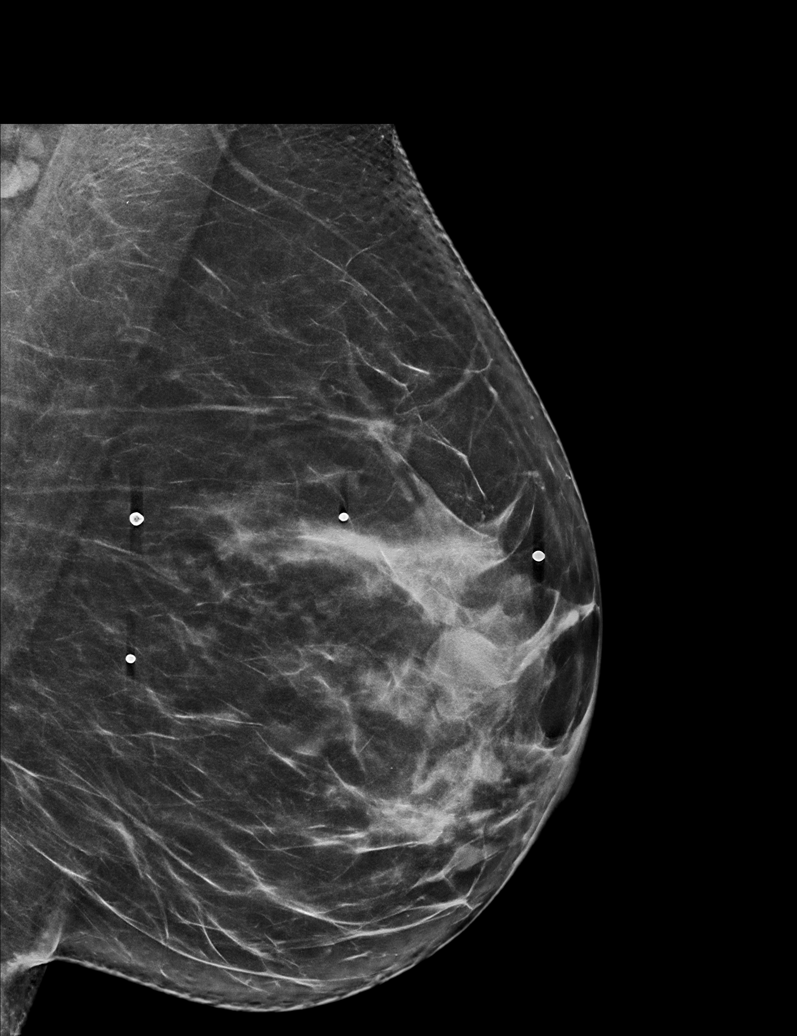

[L TAN tomo · tomo slice 39/78.0]
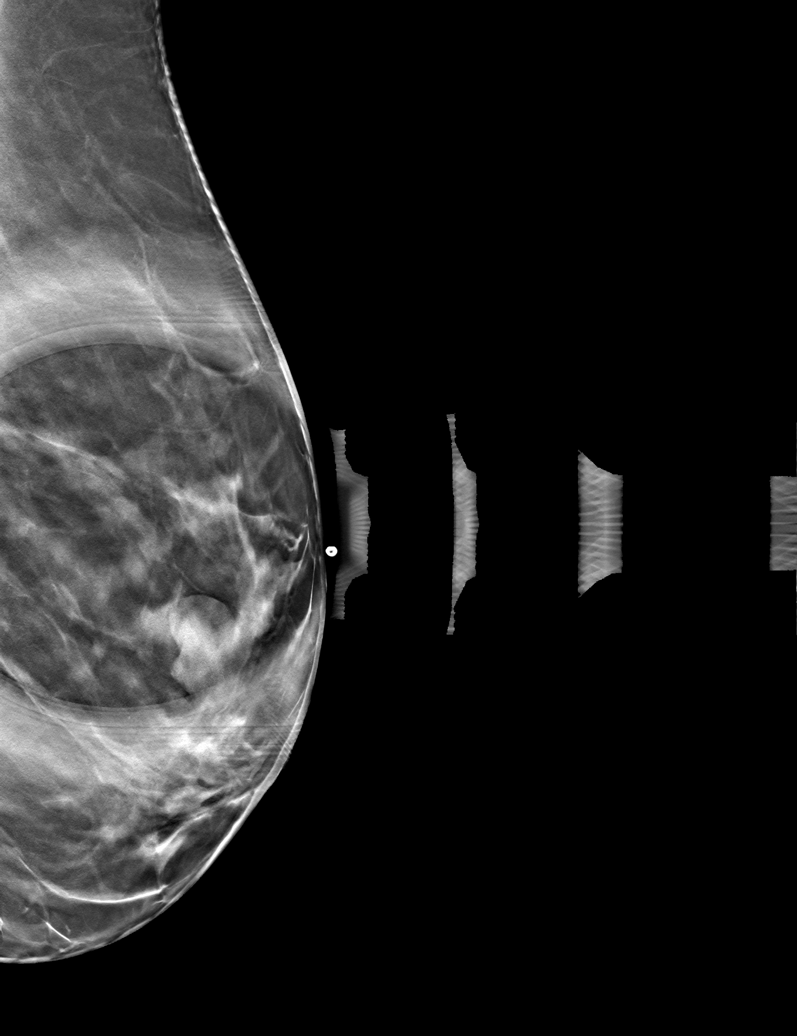

[L MLO tomo · tomo slice 38/75.0]
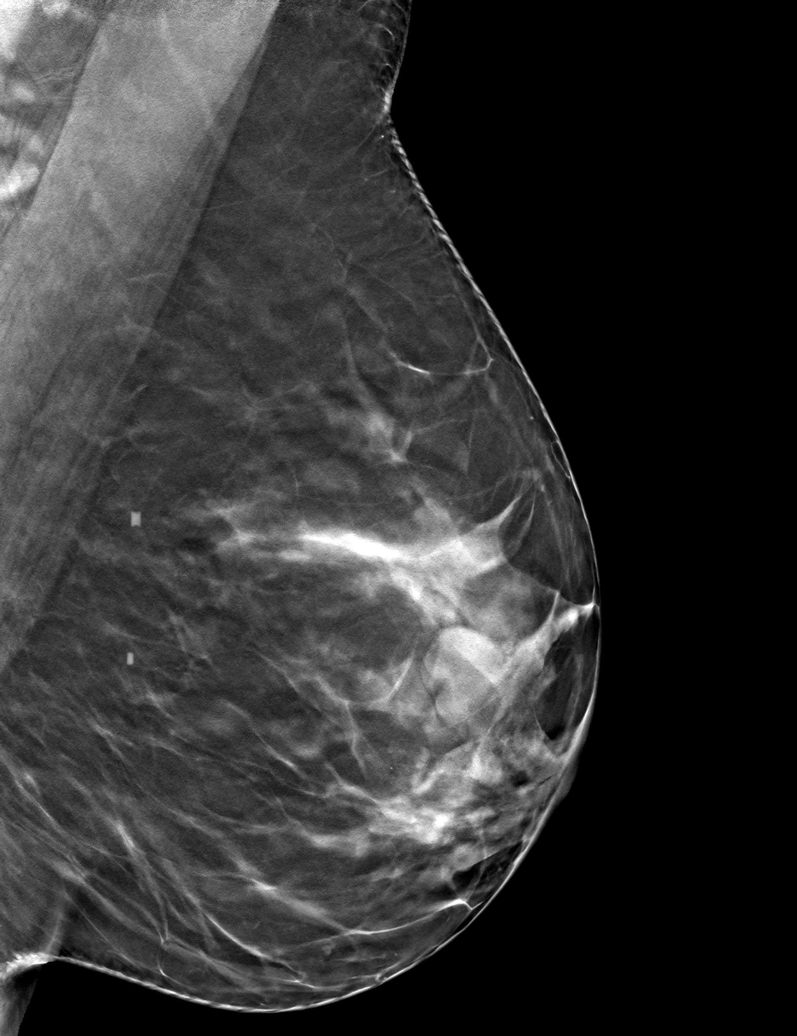

[L CC tomo · tomo slice 37/74.0]
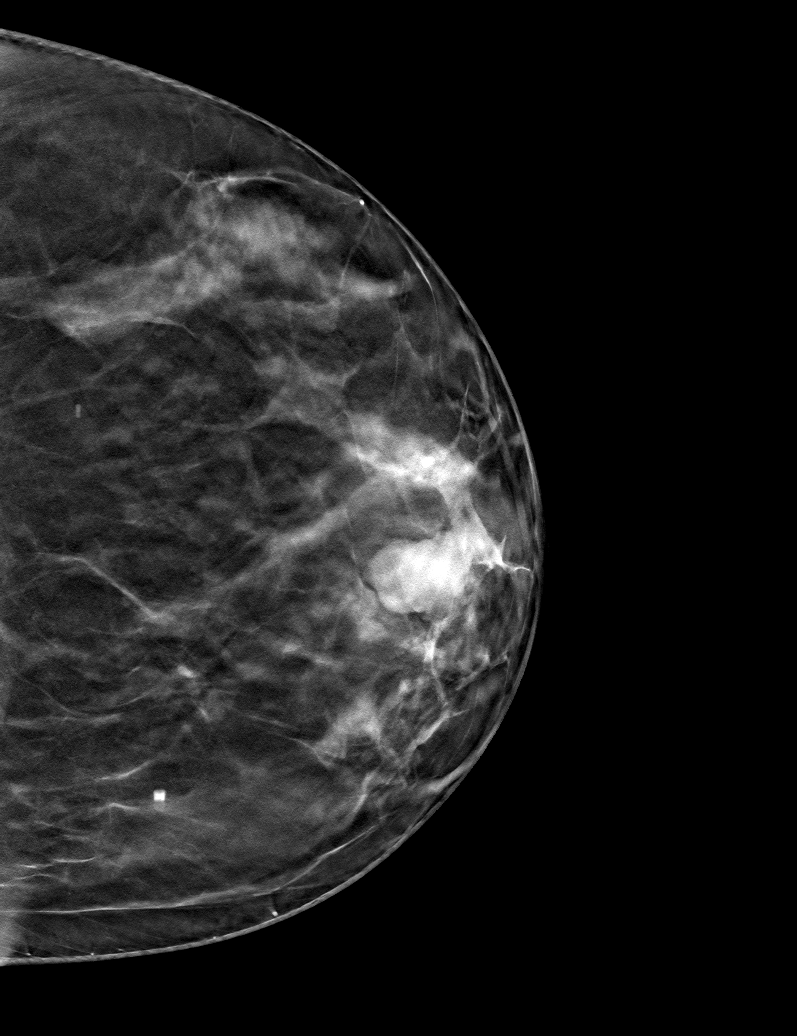

[6 of 18 positions shown; findings below may reference images not displayed]

ACR Breast Density Category c: The breast tissue is heterogeneously
dense, which may obscure small masses.
FINDINGS: There is an oval, circumscribed mass in the 12 o'clock retroareolar
left breast in the region of the mass felt by the patient, marked
with a metallic marker. No findings elsewhere in the breast
suspicious for malignancy.

Mammographic images were processed with CAD.

On physical exam, there is mild palpable soft tissue thickening in
the 12 o'clock retroareolar and periareolar left breast without a
discrete palpable mass today.

Targeted ultrasound is performed, showing a 2.2 cm simple cyst in
the 12 o'clock retroareolar left breast, corresponding to the
palpable mass. There are multiple additional smaller adjacent cysts.
IMPRESSION: Benign left breast cysts.  No evidence of malignancy.

RECOMMENDATION:
Bilateral screening mammogram in 3 months when due. That will be 1
year since mammographic evaluation of the right breast.

I have discussed the findings and recommendations with the patient.
Results were also provided in writing at the conclusion of the
visit. If applicable, a reminder letter will be sent to the patient
regarding the next appointment.

BI-RADS CATEGORY  2: Benign.

## 2020-05-06 ENCOUNTER — Ambulatory Visit (INDEPENDENT_AMBULATORY_CARE_PROVIDER_SITE_OTHER): Payer: BC Managed Care – PPO

## 2020-05-06 ENCOUNTER — Ambulatory Visit (INDEPENDENT_AMBULATORY_CARE_PROVIDER_SITE_OTHER): Payer: BC Managed Care – PPO | Admitting: Podiatry

## 2020-05-06 ENCOUNTER — Other Ambulatory Visit: Payer: Self-pay

## 2020-05-06 DIAGNOSIS — M2042 Other hammer toe(s) (acquired), left foot: Secondary | ICD-10-CM

## 2020-05-06 DIAGNOSIS — M7752 Other enthesopathy of left foot: Secondary | ICD-10-CM | POA: Diagnosis not present

## 2020-05-10 NOTE — Progress Notes (Signed)
   HPI: 54 y.o. female presenting today as a new patient for evaluation of a symptomatic hammertoe to the left second digit.  This is been ongoing for approximately 3-4 months now.  Patient states that she has been having trouble with the arch area and the ball of her foot.  The pain is only intermittent and random every now and then.  She presents for further treatment and evaluation  Past Medical History:  Diagnosis Date  . Atypical nevus 01/29/2015   mild atypia - between breast  . 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 Medical history non-contributory       Objective: Physical Exam General: The patient is alert and oriented x3 in no acute distress.  Dermatology: Skin is cool, dry and supple bilateral lower extremities. Negative for open lesions or macerations.  Vascular: Palpable pedal pulses bilaterally. No edema or erythema noted. Capillary refill within normal limits.  Neurological: Epicritic and protective threshold grossly intact bilaterally.   Musculoskeletal Exam: All pedal and ankle joints range of motion within normal limits bilateral. Muscle strength 5/5 in all groups bilateral. Hammertoe contracture deformity noted to the left second digit.  There is also some pain on palpation range of motion of the second MTPJ of the left foot  Radiographic Exam: Hammertoe contracture deformity noted to the interphalangeal joints and MPJ of the respective hammertoe digit mentioned on clinical musculoskeletal exam.     Assessment: 1.  Hammertoe contracture left second digit 2.  Second MTPJ capsulitis left   Plan of Care:  1. Patient evaluated. X-Rays reviewed.  2.  The pain is only intermittent and occasional.  Offloading metatarsal pads were provided for the patient to offload the forefoot and applied to the insoles of her shoes 3.  Continue wearing [place] sandals during the summertime.  Patient wears [place] sandals during  the summer 4.  Continue OTC Motrin as needed 5.  Return to clinic as needed    Edrick Kins, DPM [facility]  Dr. Edrick Kins, DPM    [address], [place] [address]                Office [phone]  Fax [phone]

## 2020-07-11 ENCOUNTER — Other Ambulatory Visit: Payer: Self-pay | Admitting: Obstetrics and Gynecology

## 2020-07-11 DIAGNOSIS — R928 Other abnormal and inconclusive findings on diagnostic imaging of breast: Secondary | ICD-10-CM

## 2020-08-02 ENCOUNTER — Ambulatory Visit
Admission: RE | Admit: 2020-08-02 | Discharge: 2020-08-02 | Disposition: A | Discharge status: Home / Self Care | Payer: BC Managed Care – PPO | Source: Ambulatory Visit | Attending: Obstetrics and Gynecology | Admitting: Obstetrics and Gynecology

## 2020-08-02 ENCOUNTER — Other Ambulatory Visit: Payer: Self-pay

## 2020-08-02 ENCOUNTER — Telehealth: Payer: Self-pay | Admitting: Gastroenterology

## 2020-08-02 DIAGNOSIS — R928 Other abnormal and inconclusive findings on diagnostic imaging of breast: Secondary | ICD-10-CM

## 2020-08-02 NOTE — Telephone Encounter (Signed)
LVM for pt to call back. We have received rcd's for review.

## 2020-08-21 ENCOUNTER — Telehealth: Payer: Self-pay | Admitting: Gastroenterology

## 2020-08-21 NOTE — Telephone Encounter (Signed)
Hey Dr. Havery Moros,   We received a referral in our office for colonoscopy. Patient had previous colonoscopy in 2016. We have records. Could you please review and advise on scheduling?  Thank you

## 2020-08-21 NOTE — Telephone Encounter (Signed)
Okay thanks for the follow up and calling her.

## 2020-08-21 NOTE — Telephone Encounter (Signed)
Spoke with patient. She does not have a family history of colon cancer nor is she experiencing any GI symptoms. I informed patient she would not be due until 2026. She expressed understanding and wanted to make sure she didn't miss it. States she is perfectly fine.  Thank you.

## 2020-08-21 NOTE — Telephone Encounter (Signed)
Prior colonoscopy reviewed, done by [facility] in 2016. Good prep. One small rectal hyperplastic polyp. Based on this result she would not be due for a regular colonoscopy until 2026 unless she has a strong family history of colon cancer. Can you clarify? If she wishes to have one for another reason she would need to see [place] in clinic, which is fine. Thanks

## 2021-03-12 ENCOUNTER — Ambulatory Visit: Payer: BC Managed Care – PPO | Admitting: Physician Assistant

## 2021-03-12 ENCOUNTER — Encounter: Payer: Self-pay | Admitting: Physician Assistant

## 2021-03-12 ENCOUNTER — Other Ambulatory Visit (INDEPENDENT_AMBULATORY_CARE_PROVIDER_SITE_OTHER): Payer: BC Managed Care – PPO

## 2021-03-12 VITALS — BP 114/70 | HR 92 | Ht 65.25 in | Wt 196.0 lb

## 2021-03-12 DIAGNOSIS — R748 Abnormal levels of other serum enzymes: Secondary | ICD-10-CM

## 2021-03-12 DIAGNOSIS — Z1212 Encounter for screening for malignant neoplasm of rectum: Secondary | ICD-10-CM

## 2021-03-12 DIAGNOSIS — Z1211 Encounter for screening for malignant neoplasm of colon: Secondary | ICD-10-CM | POA: Diagnosis not present

## 2021-03-12 NOTE — Patient Instructions (Signed)
You will be contacted by [facility] in the next 2 days to arrange a RUQ Ultrasound.  The number on your caller ID will be [phone], please answer when they call.  If you have not heard from them in 2 days please call [phone] to schedule.    You have been scheduled for an abdominal ultrasound at [facility] (1st floor of hospital) on _________ at ________. Please arrive 15 minutes prior to your appointment for registration. Make certain not to have anything to eat or drink 6 hours prior to your appointment. Should you need to reschedule your appointment, please contact radiology at [phone]. This test typically takes about 30 minutes to perform.     Your provider has requested that you go to the basement level for lab work before leaving today. Press "B" on the elevator. The lab is located at the first door on the left as you exit the elevator.   Due to recent changes in healthcare laws, you may see the results of your imaging and laboratory studies on MyChart before your provider has had a chance to review them.  We understand that in some cases there may be results that are confusing or concerning to you. Not all laboratory results come back in the same time frame and the provider may be waiting for multiple results in order to interpret others.  Please give [place] 48 hours in order for your provider to thoroughly review all the results before contacting the office for clarification of your results.    If you are age 71 or older, your body mass index should be between 23-30. Your Body mass index is 32.37 kg/m. If this is out of the aforementioned range listed, please consider follow up with your Primary Care Provider.  If you are age 109 or younger, your body mass index should be between 19-25. Your Body mass index is 32.37 kg/m. If this is out of the aformentioned range listed, please consider follow up with your Primary Care Provider.    ________________________________________________________  The [facility] providers would like to encourage you to use [facility] to communicate with providers for non-urgent requests or questions.  Due to long hold times on the telephone, sending your provider a message by [facility] may be a faster and more efficient way to get a response.  Please allow 48 business hours for a response.  Please remember that this is for non-urgent requests.  _______________________________________________________   I appreciate the  opportunity to care for you  Thank You  Lovett Calender

## 2021-03-12 NOTE — Progress Notes (Signed)
Chief Complaint: Elevated LFTs  HPI:    Tonya Torres is a 56 year old female with a past medical history as listed below who was referred to me by Arvella Nigh, MD for a complaint of elevated LFTs.    08/09/2012 CT abdomen pelvis with contrast showed normal appendix and a small amount of free fluid in the pelvis likely be physiologic, fibroid uterus.    12/31/2014 colonoscopy with [facility] showed nonbleeding internal hemorrhoids and one 4 mm polyp in the rectum and otherwise normal.  Pathology showed hyperplastic polyp and repeat recommended 10 years.    07/08/2020 LFTs with an alk phos of 141 and otherwise normal.    12/09/2020 LFTs with an alk phos of 131 and other LFTs normal.  GGT normal.    Today, the patient presents to clinic and tells me that she has not been really sure why she is here but does describe a minimal elevation in her alk phos for years now that seems to go up and down.  She has started drinking more lemon water and feels like this might be helping.  Denies any previous work-up.    Denies fever, chills, weight loss, change in bowel habits, abdominal pain, heartburn, reflux, family history of liver disease, IV drug use, previous blood transfusions, previous hepatitis, herbal supplements or extreme Tylenol use.   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     Multiple Vitamin (MULTI-VITAMIN DAILY PO) Take 1 tablet by mouth as needed.     XIIDRA 5 % SOLN Apply 1 drop to eye 2 (two) times daily.     No current facility-administered medications for this visit.    Allergies as of 03/12/2021 - Review Complete 03/12/2021  Allergen Reaction Noted   Penicillins Rash 08/08/2012    Family History  Problem Relation Age of Onset   Dementia Mother    Heart murmur Mother    Melanoma Sister    Stomach cancer Paternal Grandmother     Social History   Socioeconomic History   Marital status: Married    Spouse name: Not on file   Number of children: Not on file   Years of education: Not on file   Highest education level: Not on file  Occupational History   Not on file  Tobacco Use   Smoking status: Never   Smokeless tobacco: Never  Substance and Sexual Activity   Alcohol use: No   Drug use: No   Sexual activity: Not on file  Other Topics Concern   Not on file  Social History Narrative   Not on file   Social Determinants of Health   Financial Resource Strain: Not on file  Food Insecurity: Not on file  Transportation Needs: Not on file  Physical Activity: Not on file  Stress: Not on file  Social Connections: Not  on file  Intimate Partner Violence: Not on file    Review of Systems:    Constitutional: No weight loss, fever or chills Skin: No rash  Cardiovascular: No chest pain Respiratory: No SOB  Gastrointestinal: See HPI and otherwise negative Genitourinary: No dysuria  Neurological: No headache, dizziness or syncope Musculoskeletal: No new muscle or joint pain Hematologic: No bleeding  Psychiatric: No history of depression or anxiety   Physical Exam:  Vital signs: BP 114/70 (BP Location: Left Arm, Patient Position: Sitting, Cuff Size: Normal)    Pulse 92    Ht 5' 5.25" (1.657 m) Comment: height measured without shoes   Wt 196 lb (88.9 kg)    LMP 12/14/2012    BMI 32.37 kg/m    Constitutional:    Pleasant overweight Caucasian female appears to be in NAD, Well developed, Well nourished, alert and cooperative Head:  Normocephalic and atraumatic. Eyes:   PEERL, EOMI. No icterus. Conjunctiva pink. Ears:  Normal auditory acuity. Neck:  Supple Throat: Oral cavity and pharynx without inflammation, swelling or lesion.  Respiratory: Respirations even and unlabored. Lungs clear to auscultation bilaterally.   No wheezes, crackles, or rhonchi.  Cardiovascular: Normal S1, S2. No MRG. Regular rate and rhythm. No peripheral edema, cyanosis or pallor.  Gastrointestinal:  Soft, nondistended, nontender. No rebound or guarding. Normal bowel sounds. No appreciable masses or hepatomegaly. Rectal:  Not performed.  Msk:  Symmetrical without gross deformities. Without edema, no deformity or joint abnormality.  Neurologic:  Alert and  oriented x4;  grossly normal neurologically.  Skin:   Dry and intact without significant lesions or rashes. Psychiatric: Demonstrates good judgement and reason without abnormal affect or behaviors.  See HPI for recent labs.  Assessment: 1.  Elevated alkaline phosphatase: Has been minimally elevated over the past few years and occasionally returns to normal per the patient, most recently has been decreasing over the past 6 months; most likely fatty liver 2.  Screening for colorectal cancer: Last colonoscopy in 2016 with recommendations to repeat in 10 years  Plan: 1.  Ordered right upper quadrant ultrasound. 2.  Repeat hepatic function panel today. 3.  Avoid hepatotoxic substances. 4.  Patient to follow in clinic per recommendations after above.  She was assigned to Dr. Ardis Hughs this afternoon  [name], PA-C [facility] 03/12/2021, 2:26 PM  Cc: Arvella Nigh, MD

## 2021-03-13 LAB — HEPATIC FUNCTION PANEL
ALT: 13 U/L (ref 0–35)
AST: 18 U/L (ref 0–37)
Albumin: 4.1 g/dL (ref 3.5–5.2)
Alkaline Phosphatase: 103 U/L (ref 39–117)
Bilirubin, Direct: 0 mg/dL (ref 0.0–0.3)
Total Bilirubin: 0.3 mg/dL (ref 0.2–1.2)
Total Protein: 7 g/dL (ref 6.0–8.3)

## 2021-03-13 NOTE — Progress Notes (Signed)
I agree with the above note, plan 

## 2021-04-03 ENCOUNTER — Other Ambulatory Visit: Payer: Self-pay

## 2021-04-03 ENCOUNTER — Ambulatory Visit: Payer: BC Managed Care – PPO | Admitting: Physician Assistant

## 2021-04-03 ENCOUNTER — Encounter: Payer: Self-pay | Admitting: Physician Assistant

## 2021-04-03 DIAGNOSIS — Z1283 Encounter for screening for malignant neoplasm of skin: Secondary | ICD-10-CM

## 2021-04-03 DIAGNOSIS — Z86018 Personal history of other benign neoplasm: Secondary | ICD-10-CM | POA: Diagnosis not present

## 2021-04-03 DIAGNOSIS — Z808 Family history of malignant neoplasm of other organs or systems: Secondary | ICD-10-CM | POA: Diagnosis not present

## 2021-04-03 DIAGNOSIS — Z840 Family history of diseases of the skin and subcutaneous tissue: Secondary | ICD-10-CM | POA: Diagnosis not present

## 2021-04-03 NOTE — Progress Notes (Signed)
? ?  Follow-Up Visit ?  ?Subjective  ?Tonya Torres is a 56 y.o. female who presents for the following: Annual Exam (Patient here today for yearly skin check. Per patient she has list of lesions that she would like check per patient non of the lesions are bleeding. Personal history of atypical mole. Family history of atypical moles, and melanoma. No personal history or family history of non mole skin cancer. ). ? ? ?The following portions of the chart were reviewed this encounter and updated as appropriate:  Tobacco  Allergies  Meds  Problems  Med Hx  Surg Hx  Fam Hx   ?  ? ?Objective  ?Well appearing patient in no apparent distress; mood and affect are within normal limits. ? ?A full examination was performed including scalp, head, eyes, ears, nose, lips, neck, chest, axillae, abdomen, back, buttocks, bilateral upper extremities, bilateral lower extremities, hands, feet, fingers, toes, fingernails, and toenails. All findings within normal limits unless otherwise noted below. ? ?head to toe ?No signs of non-mole skin cancer or atypical nevi  ? ? ?Assessment & Plan  ?Encounter for screening for malignant neoplasm of skin ?head to toe ? ?Yearly skin checks. ? ?No atypical nevi noted at the time of the visit. ? ?I, Derin Matthes, PA-C, have reviewed all documentation's for this visit.  The documentation on 04/03/21 for the exam, diagnosis, procedures and orders are all accurate and complete. ?

## 2021-09-01 ENCOUNTER — Encounter: Payer: Self-pay | Admitting: Physician Assistant

## 2021-09-23 ENCOUNTER — Ambulatory Visit (HOSPITAL_COMMUNITY): Payer: BC Managed Care – PPO | Attending: Orthopedic Surgery | Admitting: Occupational Therapy

## 2021-09-23 DIAGNOSIS — M25512 Pain in left shoulder: Secondary | ICD-10-CM | POA: Insufficient documentation

## 2021-09-23 NOTE — Therapy (Signed)
[place], [place], [address] Phone: [phone]   Fax:  [phone]  Patient Details  Name: Tonya Torres MRN: [id number] Date of Birth: 1965/04/01 Referring Provider:  Tania Ade, MD  Encounter Date: 09/23/2021  Cancellation: Pt arrived for appointment and reports that she has been receiving therapy at another clinic, however wishes to switch to this clinic as it is closer to home. Evaluation cancelled at this time due to insurance. Clinic is calling insurance to find out exact benefits and co-pays. Therapist will call pt back with information and determine whether she would like to return for therapy.    Aziya Arena Isabelle Course, OTR/L 09/23/2021, 1:29 PM  [facility] [address] [place], [place], [address] Phone: [phone]   Fax:  [phone]

## 2022-04-13 ENCOUNTER — Other Ambulatory Visit: Payer: Self-pay | Admitting: Obstetrics and Gynecology

## 2022-04-13 DIAGNOSIS — N6320 Unspecified lump in the left breast, unspecified quadrant: Secondary | ICD-10-CM

## 2022-04-23 ENCOUNTER — Ambulatory Visit
Admission: RE | Admit: 2022-04-23 | Discharge: 2022-04-23 | Disposition: A | Discharge status: Home / Self Care | Payer: BC Managed Care – PPO | Source: Ambulatory Visit | Attending: Obstetrics and Gynecology | Admitting: Obstetrics and Gynecology

## 2022-04-23 DIAGNOSIS — N6320 Unspecified lump in the left breast, unspecified quadrant: Secondary | ICD-10-CM

## 2022-09-03 ENCOUNTER — Other Ambulatory Visit: Payer: Self-pay | Admitting: Obstetrics and Gynecology

## 2022-09-03 DIAGNOSIS — R928 Other abnormal and inconclusive findings on diagnostic imaging of breast: Secondary | ICD-10-CM

## 2022-09-10 ENCOUNTER — Other Ambulatory Visit: Payer: BC Managed Care – PPO

## 2022-09-18 ENCOUNTER — Ambulatory Visit: Admission: RE | Admit: 2022-09-18 | Payer: BC Managed Care – PPO | Source: Ambulatory Visit

## 2022-09-18 ENCOUNTER — Ambulatory Visit
Admission: RE | Admit: 2022-09-18 | Discharge: 2022-09-18 | Disposition: A | Discharge status: Home / Self Care | Payer: BC Managed Care – PPO | Source: Ambulatory Visit | Attending: Obstetrics and Gynecology | Admitting: Obstetrics and Gynecology

## 2022-09-18 DIAGNOSIS — R928 Other abnormal and inconclusive findings on diagnostic imaging of breast: Secondary | ICD-10-CM

## 2022-10-08 ENCOUNTER — Ambulatory Visit: Payer: Self-pay

## 2022-10-10 ENCOUNTER — Other Ambulatory Visit: Payer: Self-pay

## 2022-10-10 ENCOUNTER — Ambulatory Visit
Admission: RE | Admit: 2022-10-10 | Discharge: 2022-10-10 | Disposition: A | Discharge status: Home / Self Care | Payer: BC Managed Care – PPO | Source: Ambulatory Visit | Attending: Family Medicine | Admitting: Family Medicine

## 2022-10-10 ENCOUNTER — Ambulatory Visit: Payer: BC Managed Care – PPO

## 2022-10-10 VITALS — BP 111/74 | HR 69 | Temp 98.0°F | Resp 18

## 2022-10-10 DIAGNOSIS — M546 Pain in thoracic spine: Secondary | ICD-10-CM

## 2022-10-10 DIAGNOSIS — R142 Eructation: Secondary | ICD-10-CM

## 2022-10-10 NOTE — ED Triage Notes (Signed)
Pt reports she has been having mid back pain that radiates to her side x 5 days. Has been burping. Denies injury.     Took motrin and tylenol.

## 2022-10-11 LAB — COMPREHENSIVE METABOLIC PANEL
ALT: 12 IU/L (ref 0–32)
AST: 19 IU/L (ref 0–40)
Albumin: 4.6 g/dL (ref 3.8–4.9)
Alkaline Phosphatase: 107 IU/L (ref 44–121)
BUN/Creatinine Ratio: 23 (ref 9–23)
BUN: 18 mg/dL (ref 6–24)
Bilirubin Total: 0.3 mg/dL (ref 0.0–1.2)
CO2: 21 mmol/L (ref 20–29)
Calcium: 9.6 mg/dL (ref 8.7–10.2)
Chloride: 106 mmol/L (ref 96–106)
Creatinine, Ser: 0.8 mg/dL (ref 0.57–1.00)
Globulin, Total: 2.1 g/dL (ref 1.5–4.5)
Glucose: 90 mg/dL (ref 70–99)
Potassium: 4.8 mmol/L (ref 3.5–5.2)
Sodium: 139 mmol/L (ref 134–144)
Total Protein: 6.7 g/dL (ref 6.0–8.5)
eGFR: 88 mL/min/{1.73_m2} (ref >59)

## 2022-10-11 LAB — CBC
Hematocrit: 39.6 % (ref 34.0–46.6)
Hemoglobin: 12.8 g/dL (ref 11.1–15.9)
MCH: 26.4 pg — ABNORMAL LOW (ref 26.6–33.0)
MCHC: 32.3 g/dL (ref 31.5–35.7)
MCV: 82 fL (ref 79–97)
Platelets: 237 10*3/uL (ref 150–450)
RBC: 4.85 x10E6/uL (ref 3.77–5.28)
RDW: 14.6 % (ref 11.7–15.4)
WBC: 5.4 10*3/uL (ref 3.4–10.8)

## 2022-10-14 NOTE — ED Provider Notes (Signed)
[facility]   [id number] 10/10/22 Arrival Time: 4098  ASSESSMENT & PLAN:  1. Acute midline thoracic back pain   2. Belching    Suspect GERD but cannot r/o intra-abdominal process or aortic dissection. Appears to be in pain. BP 111/74 (BP Location: Right Arm)   Pulse 69   Temp 98 F (36.7 C) (Oral)   Resp 18   LMP 12/14/2012   SpO2 96%  Recommend ED evaluation. She agrees; by POV; stable upon discharge.  Labs unremarkable Results for orders placed or performed during the hospital encounter of 10/10/22  CBC  Result Value Ref Range   WBC 5.4 3.4 - 10.8 x10E3/uL   RBC 4.85 3.77 - 5.28 x10E6/uL   Hemoglobin 12.8 11.1 - 15.9 g/dL   Hematocrit 11.9 14.7 - 46.6 %   MCV 82 79 - 97 fL   MCH 26.4 (L) 26.6 - 33.0 pg   MCHC 32.3 31.5 - 35.7 g/dL   RDW 82.9 56.2 - 13.0 %   Platelets 237 150 - 450 x10E3/uL  Comprehensive metabolic panel  Result Value Ref Range   Glucose 90 70 - 99 mg/dL   BUN 18 6 - 24 mg/dL   Creatinine, Ser 8.65 0.57 - 1.00 mg/dL   eGFR 88 >78 IO/NGE/9.52   BUN/Creatinine Ratio 23 9 - 23   Sodium 139 134 - 144 mmol/L   Potassium 4.8 3.5 - 5.2 mmol/L   Chloride 106 96 - 106 mmol/L   CO2 21 20 - 29 mmol/L   Calcium 9.6 8.7 - 10.2 mg/dL   Total Protein 6.7 6.0 - 8.5 g/dL   Albumin 4.6 3.8 - 4.9 g/dL   Globulin, Total 2.1 1.5 - 4.5 g/dL   Bilirubin Total 0.3 0.0 - 1.2 mg/dL   Alkaline Phosphatase 107 44 - 121 IU/L   AST 19 0 - 40 IU/L   ALT 12 0 - 32 IU/L    Reviewed expectations re: course of current medical issues. Questions answered. Outlined signs and symptoms indicating need for more acute intervention. Patient verbalized understanding. After Visit Summary given.   SUBJECTIVE: History from: patient.  Tonya Torres is a 57 y.o. female who presents with complaint of  reports she has been having mid back pain that radiates to her side x 5 days. Feels pain is worsening and does wake her at night. Has been burping but this has been longer  than the duration of current pain. Denies injury.  Some abd pain. Denies specific CP/SOB. Denies n/v. Ambulatory here. Tylenol without much relief.  Denies chronic steroid use, fevers, IV drug use, or recent back surgeries or procedures.    OBJECTIVE:  Vitals:   10/10/22 1016  BP: 111/74  Pulse: 69  Resp: 18  Temp: 98 F (36.7 C)  TempSrc: Oral  SpO2: 96%    General appearance: alert; no distress HEENT: [place]; AT Neck: supple with FROM; without midline tenderness CV: regular Lungs: unlabored respirations; speaks full sentences without difficulty Abdomen: soft, non-tender; non-distended Back: no specific tenderness to palpation, reports pain is "deeper"; FROM at waist; bruising: none; without midline tenderness Extremities: without edema; symmetrical without gross deformities; normal ROM of bilateral LE Skin: warm and dry Neurologic: normal gait; normal sensation and strength of bilateral LE Psychological: alert and cooperative; normal mood and affect  Labs: Results for orders placed or performed during the hospital encounter of 10/10/22  CBC  Result Value Ref Range   WBC 5.4 3.4 - 10.8 x10E3/uL   RBC 4.85 3.77 -  5.28 x10E6/uL   Hemoglobin 12.8 11.1 - 15.9 g/dL   Hematocrit 64.4 03.4 - 46.6 %   MCV 82 79 - 97 fL   MCH 26.4 (L) 26.6 - 33.0 pg   MCHC 32.3 31.5 - 35.7 g/dL   RDW 74.2 59.5 - 63.8 %   Platelets 237 150 - 450 x10E3/uL  Comprehensive metabolic panel  Result Value Ref Range   Glucose 90 70 - 99 mg/dL   BUN 18 6 - 24 mg/dL   Creatinine, Ser 7.56 0.57 - 1.00 mg/dL   eGFR 88 >43 PI/RJJ/8.84   BUN/Creatinine Ratio 23 9 - 23   Sodium 139 134 - 144 mmol/L   Potassium 4.8 3.5 - 5.2 mmol/L   Chloride 106 96 - 106 mmol/L   CO2 21 20 - 29 mmol/L   Calcium 9.6 8.7 - 10.2 mg/dL   Total Protein 6.7 6.0 - 8.5 g/dL   Albumin 4.6 3.8 - 4.9 g/dL   Globulin, Total 2.1 1.5 - 4.5 g/dL   Bilirubin Total 0.3 0.0 - 1.2 mg/dL   Alkaline Phosphatase 107 44 - 121 IU/L   AST  19 0 - 40 IU/L   ALT 12 0 - 32 IU/L   Labs Reviewed  CBC - Abnormal; Notable for the following components:      Result Value   MCH 26.4 (*)    All other components within normal limits   Narrative:    Performed at:  [address] [facility] [address] [place], [place]  [id number] Lab Director: Jolene Schimke MD, Phone:  [phone]  COMPREHENSIVE METABOLIC PANEL   Narrative:    Performed at:  [address] [facility] [address] [place], [place]  [id number] Lab Director: Jolene Schimke MD, Phone:  [phone]  LIPASE, BLOOD    Imaging: DG Chest 2 View  Result Date: 10/10/2022 CLINICAL DATA:  thoracic back pain EXAM: CHEST - 2 VIEW COMPARISON:  None Available. FINDINGS: No pleural effusion. No pneumothorax. No focal airspace opacity. Normal cardiac and mediastinal contours. No radiographically apparent displaced rib fractures. Visualized upper abdomen is unremarkable. Vertebral body heights are maintained. IMPRESSION: No focal airspace opacity.  Vertebral body heights are preserved. Electronically Signed   By: Lorenza Cambridge M.D.   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   moderate atypia (widershave done 01/29/2015)   Colon polyps    Medical history non-contributory    Social History   Socioeconomic History   Marital status: Married    Spouse name: Not on file   Number of children: 1   Years of education: Not on file   Highest education level: Not on file  Occupational History   Occupation: Catering manager  Tobacco Use   Smoking status: Former    Current packs/day: 0.00    Types: Cigarettes    Quit date: 1996    Years since quitting: 28.7   Smokeless tobacco: Never  Vaping Use   Vaping status: Never Used  Substance and Sexual Activity   Alcohol use: No   Drug  use: No   Sexual activity: Not on file  Other Topics Concern   Not on file  Social History Narrative   Not on file   Social Determinants of Health   Financial Resource Strain:  Not on file  Food Insecurity: Not on file  Transportation Needs: Not on file  Physical Activity: Not on file  Stress: Not on file  Social Connections: Not on file  Intimate Partner Violence: Not on file   Family History  Problem Relation Age of Onset   Dementia Mother    Heart murmur Mother    Melanoma Sister    Stomach cancer Paternal Grandmother    Past Surgical History:  Procedure Laterality Date   BREAST BIOPSY Bilateral    ENDOMETRIAL ABLATION     LAPAROSCOPIC ASSISTED VAGINAL HYSTERECTOMY N/A 12/26/2012   Procedure: LAPAROSCOPIC ASSISTED VAGINAL HYSTERECTOMY;  Surgeon: Juluis Mire, MD;  Location: [facility];  Service: Gynecology;  Laterality: N/A;      Mardella Layman, MD 10/14/22 1455
